# Patient Record
Sex: Male | Born: 1935 | Race: White | Hispanic: No | Smoking: Former smoker
Health system: Southern US, Community
[De-identification: ages and names within clinical notes are randomized; demographics above are authoritative.]

## PROBLEM LIST (undated history)

## (undated) DIAGNOSIS — G629 Polyneuropathy, unspecified: Secondary | ICD-10-CM

## (undated) DIAGNOSIS — IMO0002 Reserved for concepts with insufficient information to code with codable children: Secondary | ICD-10-CM

## (undated) DIAGNOSIS — R079 Chest pain, unspecified: Secondary | ICD-10-CM

## (undated) DIAGNOSIS — M1990 Unspecified osteoarthritis, unspecified site: Secondary | ICD-10-CM

## (undated) DIAGNOSIS — G8929 Other chronic pain: Secondary | ICD-10-CM

## (undated) DIAGNOSIS — I272 Pulmonary hypertension, unspecified: Secondary | ICD-10-CM

## (undated) DIAGNOSIS — I10 Essential (primary) hypertension: Secondary | ICD-10-CM

## (undated) DIAGNOSIS — R9430 Abnormal result of cardiovascular function study, unspecified: Secondary | ICD-10-CM

## (undated) DIAGNOSIS — I2510 Atherosclerotic heart disease of native coronary artery without angina pectoris: Secondary | ICD-10-CM

## (undated) DIAGNOSIS — K219 Gastro-esophageal reflux disease without esophagitis: Secondary | ICD-10-CM

## (undated) DIAGNOSIS — M545 Low back pain: Secondary | ICD-10-CM

## (undated) DIAGNOSIS — E785 Hyperlipidemia, unspecified: Secondary | ICD-10-CM

## (undated) HISTORY — DX: Pulmonary hypertension, unspecified: I27.20

## (undated) HISTORY — PX: CHOLECYSTECTOMY: SHX55

## (undated) HISTORY — DX: Hyperlipidemia, unspecified: E78.5

## (undated) HISTORY — DX: Chest pain, unspecified: R07.9

## (undated) HISTORY — DX: Essential (primary) hypertension: I10

## (undated) HISTORY — DX: Atherosclerotic heart disease of native coronary artery without angina pectoris: I25.10

## (undated) HISTORY — DX: Abnormal result of cardiovascular function study, unspecified: R94.30

## (undated) HISTORY — DX: Low back pain: M54.5

## (undated) HISTORY — DX: Polyneuropathy, unspecified: G62.9

## (undated) HISTORY — PX: CATARACT EXTRACTION W/ INTRAOCULAR LENS IMPLANT: SHX1309

## (undated) HISTORY — DX: Reserved for concepts with insufficient information to code with codable children: IMO0002

## (undated) HISTORY — DX: Other chronic pain: G89.29

## (undated) HISTORY — DX: Gastro-esophageal reflux disease without esophagitis: K21.9

---

## 2002-03-01 HISTORY — PX: CORONARY ANGIOPLASTY WITH STENT PLACEMENT: SHX49

## 2002-09-23 ENCOUNTER — Inpatient Hospital Stay (HOSPITAL_COMMUNITY): Admission: EM | Admit: 2002-09-23 | Discharge: 2002-09-25 | Payer: Self-pay | Admitting: *Deleted

## 2002-12-03 ENCOUNTER — Inpatient Hospital Stay (HOSPITAL_COMMUNITY): Admission: EM | Admit: 2002-12-03 | Discharge: 2002-12-05 | Payer: Self-pay | Admitting: Cardiology

## 2004-03-05 ENCOUNTER — Ambulatory Visit: Payer: Self-pay | Admitting: Cardiology

## 2005-03-22 ENCOUNTER — Ambulatory Visit: Payer: Self-pay | Admitting: Cardiology

## 2005-12-01 ENCOUNTER — Ambulatory Visit: Payer: Self-pay | Admitting: Cardiology

## 2006-03-30 ENCOUNTER — Ambulatory Visit: Payer: Self-pay | Admitting: Cardiology

## 2006-12-02 ENCOUNTER — Encounter: Payer: Self-pay | Admitting: Cardiology

## 2006-12-22 ENCOUNTER — Ambulatory Visit: Payer: Self-pay | Admitting: Cardiology

## 2007-06-12 ENCOUNTER — Encounter: Payer: Self-pay | Admitting: Physician Assistant

## 2007-06-12 ENCOUNTER — Ambulatory Visit: Payer: Self-pay | Admitting: Cardiology

## 2007-06-13 ENCOUNTER — Encounter: Payer: Self-pay | Admitting: Cardiology

## 2007-06-13 ENCOUNTER — Encounter: Payer: Self-pay | Admitting: Physician Assistant

## 2007-07-06 ENCOUNTER — Encounter: Payer: Self-pay | Admitting: Physician Assistant

## 2007-08-01 ENCOUNTER — Ambulatory Visit: Payer: Self-pay | Admitting: Cardiology

## 2007-08-08 ENCOUNTER — Ambulatory Visit: Payer: Self-pay | Admitting: Cardiology

## 2007-09-26 ENCOUNTER — Ambulatory Visit: Payer: Self-pay | Admitting: Cardiology

## 2008-05-27 ENCOUNTER — Ambulatory Visit: Payer: Self-pay | Admitting: Cardiology

## 2008-06-06 ENCOUNTER — Ambulatory Visit: Payer: Self-pay | Admitting: Cardiology

## 2008-06-06 ENCOUNTER — Encounter: Payer: Self-pay | Admitting: Cardiology

## 2008-07-23 ENCOUNTER — Encounter: Payer: Self-pay | Admitting: Cardiology

## 2008-08-30 ENCOUNTER — Encounter: Payer: Self-pay | Admitting: Cardiology

## 2008-11-19 DIAGNOSIS — R079 Chest pain, unspecified: Secondary | ICD-10-CM

## 2008-11-19 DIAGNOSIS — I2510 Atherosclerotic heart disease of native coronary artery without angina pectoris: Secondary | ICD-10-CM

## 2008-11-19 DIAGNOSIS — E785 Hyperlipidemia, unspecified: Secondary | ICD-10-CM | POA: Insufficient documentation

## 2008-12-05 ENCOUNTER — Ambulatory Visit: Payer: Self-pay | Admitting: Cardiology

## 2008-12-05 DIAGNOSIS — I10 Essential (primary) hypertension: Secondary | ICD-10-CM | POA: Insufficient documentation

## 2008-12-06 ENCOUNTER — Encounter: Payer: Self-pay | Admitting: Cardiology

## 2009-05-27 ENCOUNTER — Encounter: Payer: Self-pay | Admitting: Cardiology

## 2009-05-29 ENCOUNTER — Encounter: Payer: Self-pay | Admitting: Cardiology

## 2009-05-30 ENCOUNTER — Encounter: Payer: Self-pay | Admitting: Cardiology

## 2009-06-06 ENCOUNTER — Encounter: Payer: Self-pay | Admitting: Cardiology

## 2009-07-10 ENCOUNTER — Ambulatory Visit: Payer: Self-pay | Admitting: Cardiology

## 2009-07-10 DIAGNOSIS — I70219 Atherosclerosis of native arteries of extremities with intermittent claudication, unspecified extremity: Secondary | ICD-10-CM | POA: Insufficient documentation

## 2009-07-15 ENCOUNTER — Encounter: Payer: Self-pay | Admitting: Cardiology

## 2009-07-23 ENCOUNTER — Encounter (INDEPENDENT_AMBULATORY_CARE_PROVIDER_SITE_OTHER): Payer: Self-pay | Admitting: *Deleted

## 2009-09-30 ENCOUNTER — Encounter: Payer: Self-pay | Admitting: Cardiology

## 2009-10-12 ENCOUNTER — Encounter: Payer: Self-pay | Admitting: Cardiology

## 2010-01-21 ENCOUNTER — Ambulatory Visit: Payer: Self-pay | Admitting: Cardiology

## 2010-01-21 DIAGNOSIS — R609 Edema, unspecified: Secondary | ICD-10-CM

## 2010-03-31 NOTE — Letter (Signed)
Summary: [facility] D/C DR. [facility]  [facility] D[facility]C DR. [facility]   Imported By: Zachary George 07/10/2009 08:47:43  _____________________________________________________________________  External Attachment:    Type:   Image     Comment:   External Document

## 2010-03-31 NOTE — Letter (Signed)
Summary: Engineer, materials at [facility]  [address]   [place], [place] [address]   Phone: [phone]  Fax: [phone]        Jul 23, 2009 MRN: [id number]   Wayne [name] [address] [place], [place]  [address]   Dear Wayne [name],  Your test ordered by Selena Batten has been reviewed by your physician (or physician assistant) and was found to be normal or stable. Your physician (or physician assistant) felt no changes were needed at this time.  ____ Echocardiogram  ____ Cardiac Stress Test  ____ Lab Work  __X__ Peripheral vascular study of arms, legs or neck  ____ CT scan or X-ray  ____ Lung or Breathing test  ____ Other:   Thank you.   Hoover Brunette, LPN    Duane Boston, M.D., F.A.C.C. Thressa Sheller, M.D., F.A.C.C. Oneal Grout, M.D., F.A.C.C. Cheree Ditto, M.D., F.A.C.C. Daiva Nakayama, M.D., F.A.C.C. Kenney Houseman, M.D., F.A.C.C. Jeanne Ivan, PA-C

## 2010-03-31 NOTE — Assessment & Plan Note (Signed)
Summary: ONLY Wayne [name] 6 MO FU PER APRIL REMINDER-SRS   Visit Type:  Follow-up Primary Provider:  Sherryll Burger  CC:  follow-up visit.  History of Present Illness: the patient is a 77 year old male patient of coronary artery disease, status post stent placement right coronary 2004. He had a recent Myoview done as well as an echocardiogram last primary care physician earlier this year compared to studies were reportedly within normal limits. The patient denies any chest pain shortness of breath orthopnea PND. At times he feels somewhat depressed. He does report some cramping in the lower extremities.  Preventive Screening-Counseling & Management  Alcohol-Tobacco     Smoking Status: quit     Year Quit: 1940  Current Medications (verified): 1)  Isosorbide Mononitrate Cr 60 Mg Xr24h-Tab (Isosorbide Mononitrate) .... Take 1/2 Tablet By Mouth Daily 2)  Metoprolol Tartrate 50 Mg Tabs (Metoprolol Tartrate) .... Take 1/2 Tablet By Mouth Two Times A Day 3)  Ecotrin 325 Mg Tbec (Aspirin) .... Take 1 Tablet By Mouth Once A Day 4)  Finasteride 5 Mg Tabs (Finasteride) .... Take 1 Tablet By Mouth Once A Day 5)  Hydrocodone-Acetaminophen 7.5-325 Mg Tabs (Hydrocodone-Acetaminophen) .... Take 1 Tablet By Mouth Four Times A Day 6)  Vitamin B-12 1000 Mcg Tabs (Cyanocobalamin) .... Take 1 Tablet By Mouth Once A Day 7)  Multivitamins  Tabs (Multiple Vitamin) .... Take 1 Tablet By Mouth Once A Day 8)  Miralax  Powd (Polyethylene Glycol 3350) .... Use As Directed 9)  Alprazolam 0.25 Mg Tabs (Alprazolam) .... Take 2 Tablet By Mouth Once A Day As Needed 10)  Amlodipine Besylate 5 Mg Tabs (Amlodipine Besylate) .... Take 1 Tablet By Mouth Once A Day 11)  Nitrostat 0.4 Mg Subl (Nitroglycerin) .Marland Kitchen.. 1 Tablet Under Tongue At Onset of Chest Pain; You May Repeat Every 5 Minutes For Up To 3 Doses. 12)  Furosemide 20 Mg Tabs (Furosemide) .... Take 1/4 Tablet By Mouth Once A Day As Needed 13)  Omega-3 Krill Oil 300 Mg Caps (Krill  Oil) .... Take 1 Tablet By Mouth Once A Day  Allergies (verified): 1)  ! Codeine 2)  ! Cortisone  Comments:  Nurse/Medical Assistant: The patient's medications and allergies were reviewed with the patient and were updated in the Medication and Allergy Lists. List reviewed.  Past History:  Past Surgical History: Last updated: 11/19/2008 Cholecystectomy  Family History: Last updated: 11/19/2008 Family History of Coronary Artery Disease:   Social History: Last updated: 11/19/2008 Retired  Married  Tobacco Use - Former.  Alcohol Use - no  Risk Factors: Smoking Status: quit (07/10/2009)  Past Medical History: HYPERLIPIDEMIA-MIXED (ICD-272.4) CHEST PAIN-UNSPECIFIED (ICD-786.50) CAD, NATIVE VESSEL (ICD-414.01) GERD Peripheral neuropathy.  Chronic low back pain  Review of Systems       The patient complains of depression.  The patient denies fatigue, malaise, fever, weight gain/loss, vision loss, decreased hearing, hoarseness, chest pain, palpitations, shortness of breath, prolonged cough, wheezing, sleep apnea, coughing up blood, abdominal pain, blood in stool, nausea, vomiting, diarrhea, heartburn, incontinence, blood in urine, muscle weakness, joint pain, leg swelling, rash, skin lesions, headache, fainting, dizziness, anxiety, enlarged lymph nodes, easy bruising or bleeding, and environmental allergies.    Vital Signs:  Patient profile:   77 year old male Height:      69 inches Weight:      179 pounds BMI:     26.53 Pulse rate:   67 / minute BP sitting:   151 / 71  (left arm) Cuff size:  regular  Vitals Entered By: Carlye Grippe (Jul 10, 2009 9:11 AM) CC: follow-up visit   Physical Exam  Additional Exam:  General: Well-developed, well-nourished in no distress head: Normocephalic and atraumatic eyes PERRLA/EOMI intact, conjunctiva and lids normal nose: No deformity or lesions mouth normal dentition, normal posterior pharynx neck: Supple, no JVD.  No  masses, thyromegaly or abnormal cervical nodes lungs: Normal breath sounds bilaterally without wheezing.  Normal percussion heart: regular rate and rhythm with normal S1 and S2, no S3 or S4.  PMI is normal.  No pathological murmurs abdomen: Normal bowel sounds, abdomen is soft and nontender without masses, organomegaly or hernias noted.  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 His updated medication list for this problem includes:    Metoprolol Tartrate 50 Mg Tabs (Metoprolol tartrate) .Marland Kitchen... Take 1/2 tablet by mouth two times a day    Ecotrin 325 Mg Tbec (Aspirin) .Marland Kitchen... Take 1 tablet by mouth once a day    Amlodipine Besylate 5 Mg Tabs (Amlodipine besylate) .Marland Kitchen... Take 1 tablet by mouth once a day    Furosemide 20 Mg Tabs (Furosemide) .Marland Kitchen... Take 1/4 tablet by mouth once a day as needed  Problem # 3:  CAD, NATIVE VESSEL (ICD-414.01) the patient reports no recurrent chest pain. Continue current medical regimen His updated medication list for this problem includes:    Isosorbide Mononitrate Cr 60 Mg Xr24h-tab (Isosorbide mononitrate) .Marland Kitchen... Take 1/2 tablet by mouth daily    Metoprolol Tartrate 50 Mg Tabs (Metoprolol tartrate) .Marland Kitchen... Take 1/2 tablet by mouth two times a day    Ecotrin 325 Mg Tbec (Aspirin) .Marland Kitchen... Take 1 tablet by mouth once a day    Amlodipine Besylate 5 Mg Tabs (Amlodipine besylate) .Marland Kitchen... Take 1 tablet by mouth once a day    Nitrostat 0.4 Mg Subl  (Nitroglycerin) .Marland Kitchen... 1 tablet under tongue at onset of chest pain; you may repeat every 5 minutes for up to 3 doses.  Patient Instructions: 1)  ABI's 2)  Follow up in  6 months

## 2010-03-31 NOTE — Medication Information (Signed)
Summary: RX Folder/ MEDICATIONS  RX Folder/ MEDICATIONS   Imported By: Dorise Hiss 07/31/2009 16:31:21  _____________________________________________________________________  External Attachment:    Type:   Image     Comment:   External Document

## 2010-03-31 NOTE — Assessment & Plan Note (Signed)
Summary: 6 MO FU PER NOV REMINDER - SRS   Visit Type:  Follow-up Primary Provider:  Dr. Franchot Mimes   History of Present Illness: the patient is a 77 year old male patient of coronary artery disease, status post stent placement right coronary 2004. He had a recent Myoview done as well as an echocardiogram last primary care physician earlier this year compared to studies were reportedly within normal limits. The patient denies any chest pain shortness of breath orthopnea PND. At times he feels somewhat depressed. He does report some cramping in the lower extremities. the patient had laboratory work done in August of 2011. His creatinine was within normal limits. His potassium was also within normal limits. the recent CT scan showed right-sided hydronephrosisWith significant perinephric stranding and fluid and an obstructing stone at the right physical ureteral junction. He also has a left renal cyst. He has mild emphysema noted at the lung bases No hematuria.  Gives out easily. Back legs feet become tired. Some LE edema.  Take la six for 3 days. 1/2 pill. Trying to stay busy.  Preventive Screening-Counseling & Management  Alcohol-Tobacco     Smoking Status: quit  Comments: Quit using tobacco (chewing and smoking) about 40 yrs ago after using tobacco products for >30 yrs  Current Medications (verified): 1)  Isosorbide Mononitrate Cr 60 Mg Xr24h-Tab (Isosorbide Mononitrate) .... Take 1/2 Tablet By Mouth Daily 2)  Metoprolol Tartrate 50 Mg Tabs (Metoprolol Tartrate) .... Take 1/2 Tablet By Mouth Two Times A Day 3)  Ecotrin 325 Mg Tbec (Aspirin) .... Take 1 Tablet By Mouth Once A Day 4)  Finasteride 5 Mg Tabs (Finasteride) .... Take 1 Tablet By Mouth Once A Day 5)  Hydrocodone-Acetaminophen 7.5-325 Mg Tabs (Hydrocodone-Acetaminophen) .... Take 1 Tablet By Mouth Four Times A Day 6)  Vitamin B-12 1000 Mcg Tabs (Cyanocobalamin) .... Take 1 Tablet By Mouth Once A Day 7)  Multivitamins  Tabs  (Multiple Vitamin) .... Take 1 Tablet By Mouth Once A Day 8)  Miralax  Powd (Polyethylene Glycol 3350) .... Use As Directed 9)  Alprazolam 0.5 Mg Tabs (Alprazolam) .... Take 1/2-1 Tablet At Bedtime Sleep 10)  Amlodipine Besylate 5 Mg Tabs (Amlodipine Besylate) .... Take 1 Tablet By Mouth Once A Day 11)  Nitrostat 0.4 Mg Subl (Nitroglycerin) .Marland Kitchen.. 1 Tablet Under Tongue At Onset of Chest Pain; You May Repeat Every 5 Minutes For Up To 3 Doses. 12)  Furosemide 20 Mg Tabs (Furosemide) .... Take 1/4 Tablet By Mouth Once A Day As Needed 13)  Omega-3 Krill Oil 300 Mg Caps (Krill Oil) .... Take 1 Tablet By Mouth Once A Day 14)  Prilosec 20 Mg Cpdr (Omeprazole) .... Take 1 Tablet By Mouth Once A Day  Allergies: 1)  ! Codeine 2)  ! Cortisone  Comments:  Nurse/Medical Assistant: The patient's medications were reviewed with the patient and were updated in the Medication List. Pt brought a list of medications to office visit.  Cyril Loosen, RN, BSN (January 21, 2010 2:51 PM)  Past History:  Past Medical History: Last updated: 07/10/2009 HYPERLIPIDEMIA-MIXED (ICD-272.4) CHEST PAIN-UNSPECIFIED (ICD-786.50) CAD, NATIVE VESSEL (ICD-414.01) GERD Peripheral neuropathy.  Chronic low back pain  Past Surgical History: Last updated: 11/19/2008 Cholecystectomy  Family History: Last updated: 11/19/2008 Family History of Coronary Artery Disease:   Social History: Last updated: 11/19/2008 Retired  Married  Tobacco Use - Former.  Alcohol Use - no  Risk Factors: Smoking Status: quit (01/21/2010)  Review of Systems       The  patient complains of leg swelling.  The patient denies fatigue, malaise, fever, weight gain/loss, vision loss, decreased hearing, hoarseness, chest pain, palpitations, shortness of breath, prolonged cough, wheezing, sleep apnea, coughing up blood, abdominal pain, blood in stool, nausea, vomiting, diarrhea, heartburn, incontinence, blood in urine, muscle weakness, joint pain,  rash, skin lesions, headache, fainting, dizziness, depression, anxiety, enlarged lymph nodes, easy bruising or bleeding, and environmental allergies.    Vital Signs:  Patient profile:   77 year old male Height:      69 inches Weight:      177 pounds Cuff size:   regular  Vitals Entered By: Cyril Loosen, RN, BSN (January 21, 2010 2:46 PM) Comments Follow up office visit. No cardiac complaints.    Physical Exam  Additional Exam:  General: Well-developed, well-nourished in no distress head: Normocephalic and atraumatic eyes PERRLA/EOMI intact, conjunctiva and lids normal nose: No deformity or lesions mouth normal dentition, normal posterior pharynx neck: Supple, no JVD.  No masses, thyromegaly or abnormal cervical nodes lungs: Normal breath sounds bilaterally without wheezing.  Normal percussion heart: regular rate and rhythm with normal S1 and S2, no S3 or S4.  PMI is normal.  No pathological murmurs abdomen: Normal bowel sounds, abdomen is soft and nontender without masses, organomegaly or hernias noted.  No hepatosplenomegaly musculoskeletal: Back normal, normal gait muscle strength and tone normal pulsus: Pulse is normal in all 4 extremities Extremities: No peripheral pitting edema neurologic: Alert and oriented x 3 skin: Intact without lesions or rashes cervical nodes: No significant adenopathy psychologic: Normal affect    Impression & Recommendations:  Problem # 1:  CAD, NATIVE VESSEL (ICD-414.01) patient has no recurrent chest pain.  He can continue with medical therapy. His updated medication list for this problem includes:    Isosorbide Mononitrate Cr 60 Mg Xr24h-tab (Isosorbide mononitrate) .Marland Kitchen... Take 1/2 tablet by mouth daily    Metoprolol Tartrate 50 Mg Tabs (Metoprolol tartrate) .Marland Kitchen... Take 1/2 tablet by mouth two times a day    Ecotrin 325 Mg Tbec (Aspirin) .Marland Kitchen... Take 1 tablet by mouth once a day    Amlodipine Besylate 5 Mg Tabs (Amlodipine besylate) .Marland Kitchen... Take  1 tablet by mouth once a day    Nitrostat 0.4 Mg Subl (Nitroglycerin) .Marland Kitchen... 1 tablet under tongue at onset of chest pain; you may repeat every 5 minutes for up to 3 doses.  Problem # 2:  EDEMA (ICD-782.3) told the patient to take Lasix for 3 days.  He may have some dependent edema.versus some diastolic dysfunction.  His lungs are clear however.  Problem # 3:  ESSENTIAL HYPERTENSION, BENIGN (ICD-401.1) Assessment: Comment Only controlled. His updated medication list for this problem includes:    Metoprolol Tartrate 50 Mg Tabs (Metoprolol tartrate) .Marland Kitchen... Take 1/2 tablet by mouth two times a day    Ecotrin 325 Mg Tbec (Aspirin) .Marland Kitchen... Take 1 tablet by mouth once a day    Amlodipine Besylate 5 Mg Tabs (Amlodipine besylate) .Marland Kitchen... Take 1 tablet by mouth once a day    Furosemide 20 Mg Tabs (Furosemide) .Marland Kitchen... Take 1/4 tablet by mouth once a day as needed  Patient Instructions: 1)  Lasix 20mg  - take 1/2 tab daily x 3 days only then resume previous 2)  Follow up in  6 months

## 2010-07-08 ENCOUNTER — Other Ambulatory Visit: Payer: Self-pay | Admitting: *Deleted

## 2010-07-08 MED ORDER — METOPROLOL TARTRATE 50 MG PO TABS: 25.0000 mg | ORAL_TABLET | Freq: Two times a day (BID) | ORAL | Status: DC

## 2010-07-14 NOTE — Assessment & Plan Note (Signed)
[facility]                          [facility] OFFICE NOTE   NAME:Wayne [name], Wayne [name]                    MRN:          [id number]  DATE:09/26/2007                            DOB:          03-20-1921    CARDIOLOGIST:  Learta Codding, MD, [facility].   PRIMARY CARE PHYSICIAN:  Dr. Linward Foster   REASON FOR VISIT:  Six week followup.   HISTORY OF PRESENT ILLNESS:  Wayne [name] is an 77 year old male patient  with a history of coronary artery disease status post prior stenting to  the distal RCA in the setting of a non-ST elevation myocardial  infarction in July 2004.  He had a nonischemic Cardiolite study in  October 2007.  He was seen in the office in April with complaints of  discomfort in his chest and shortness of breath.  It was felt that this  was somewhat atypical and possibly related to bronchitis.  The patient  was seen back in followup in June and noted that he did not take  antibiotics as previously directed.  He had a problem with over-the-  counter laxatives.  When this was changed, his symptoms seemed to  resolve.  He was seen by Gene Serpe last on August 01, 2007.  Wayne [name]  recommended proceeding with an echocardiogram.  He recommended  proceeding with cardiac catheterization if there was any decline in his  EF or development of a wall motion abnormality.   The patient had an EF of 55-60% by echocardiogram with no wall motion  abnormalities noted.  There was trace mitral regurgitation.   Now, the patient states he is doing well.  He denies any significant  change in his chest discomfort.  This is again atypical.  He denies any  symptoms reminiscent of his previous angina.  He notes continued  shortness of breath with exertion.  There has been no significant  change.  He describes NYHA class II to class III symptoms.  He denies  orthopnea, PND, or pedal edema.  He does have significant lower back  pain and is seeing an orthopedist, Dr.  Alvester Morin.  The plan at this time is  to proceed with an MRI of his lumbar spine and also to proceed with  spinal injections.   CURRENT MEDICATIONS:  1. Metoprolol 50 mg half tablet b.i.d.  2. Aspirin 325 mg daily.  3. Isosorbide 60 mg half tablet daily.  4. Nizatidine 150 mg b.i.d.  5. GlycoLax.  6. Multivitamin B12.  7. Flaxseed oil.  8. Alprazolam.  9. Avodart 5 mg daily.  10.Hydrocodone p.r.n.   PHYSICAL EXAMINATION:  GENERAL:  He is a well-nourished, well-developed  male, in no acute distress.  VITAL SIGNS:  Blood pressure is 152/77, pulse 63, and weight 176.6  pounds.  HEENT:  Normal.  NECK:  Without JVD.  CARDIAC:  Normal S1 and S2.  Regular rate and rhythm.  LUNGS:  Clear to auscultation bilaterally.  ABDOMEN:  Soft and nontender.  EXTREMITIES:  No edema.  Calves soft and nontender.  SKIN:  Warm and dry.  NEUROLOGIC:  Alert and oriented x3.  Cranial II through XII are grossly  intact.  VASCULAR:  No carotid artery bruits noted bilaterally.   IMPRESSION:  1. Atypical chest discomfort and dyspnea with exertion - stable.  2. Good left ventricular function by recent echocardiogram.  3. Coronary artery disease as previously outlined.  4. Gastroesophageal reflux disease.  5. Dyslipidemia.  6. Peripheral neuropathy.  7. Chronic lower back pain.   PLAN:  1. Wayne [name] returns for followup.  Overall, he is stable without      any significant change.  It seems as though lot of his symptoms are      related to a gastrointestinal etiology and some adjustments in his      medications improved his symptoms.  He denies any symptoms      reminiscent of his previous angina.  Recent echocardiographic study      showed normal LV function and no wall motion abnormalities.  2. The patient requests lumbar spine injections to be performed in the      near future.  He also requests that an MRI to be performed.  I have      written a note for his orthopedist in regards to his MRI.   There is      no contraindication at this time for him to have an MRI of his      lumbar spine.  3. The patient is 5 years post drug-eluting stent placement to his      distal right coronary artery.  His risk of late stent thrombosis is      extremely low.  He has been advised to restart his      aspirin postprocedure as soon as it is felt to be safe by his      orthopedist.  4. The patient will follow up with Dr. Andee Lineman in the next 6 months or      sooner.      Tereso Newcomer, PA-C  Electronically Signed      Jonelle Sidle, MD  Electronically Signed   SW/MedQ  DD: 09/26/2007  DT: 09/27/2007  Job #: [id number]   cc:   Linward Foster

## 2010-07-14 NOTE — Assessment & Plan Note (Signed)
[facility]                          [facility] NOTE   NAME:Wayne [name], Wayne [name]                    MRN:          [id number]  DATE:06/12/2007                            DOB:          [birth year]    CARDIOLOGIST:  Learta Codding, M.D.   PRIMARY CARE PHYSICIAN:  Doreen Beam, M.D.   REASON FOR VISIT:  Six-month follow-up.   HISTORY OF PRESENT ILLNESS:  Wayne [name] is an 77 year old male patient  with a history of coronary artery disease status post non-ST-elevation  myocardial infarction in July 2004 treated with a Taxus stent to the  distal RCA who presents to office to day for follow-up.  Over the last  two to four weeks, he has noted some chest discomfort.  This is an  anterior discomfort that he describes only as a pain.  He notes  radiation between his shoulder blades.  He thinks he may be a little bit  more short of breath but not significantly.  He denies any nausea but  has noted some diaphoresis.  He denies palpitations, syncope, near-  syncope.  Denies orthopnea, PND, or pedal edema.  He describes mild  dyspnea with exertion.  He describes NYHA class II symptoms.  He does  have noted cough productive of clear sputum.  Denies any fevers, chills,  or pertinent sputum.  He denies any hemoptysis.   MEDICATIONS:  1. Metoprolol 50 mg half tablet b.i.d.  2. Aspirin 325 mg daily.  3. Isosorbide 60 mg half tablet daily.  4. Ranitidine 150 mg b.i.d.  5. Glycolax.  6. Multivitamin.  7. B12 vitamin.  8. Flaxseed oil.  9. Hydrocodone/APAP p.r.n.  10.Flomax 0.4 mg daily.  11.Alprazolam 0.5 mg as directed.  12.Nitroglycerin p.r.n. chest pain.   ALLERGIES:  CORTISONE AND CODEINE.   SOCIAL HISTORY:  He is an ex-smoker.   REVIEW OF SYSTEMS:  Denies any fevers, chills, melena, hematochezia,  hematuria, dysuria.  The rest of the review of systems are negative.   PHYSICAL EXAMINATION:  He is a well-nourished, well-developed man in no  distress.  Blood pressure 131/65, pulse 69, weight 170.2 pounds.  HEENT:  Is normal.  NECK:  Without JVD.  CARDIAC:  Normal S1/S2.  Regular rate and rhythm without murmur.  LUNGS:  Clear to auscultation bilaterally.  ABDOMEN:  Soft, nontender with normoactive bowel sounds.  No  organomegaly.  EXTREMITIES:  Without edema.  Calves are soft, nontender.  SKIN:  Warm, dry.  NEUROLOGIC:  He is alert and oriented x3.  Cranial nerves II-XII are  grossly intact.  VASCULAR EXAM:  No carotid bruits noted bilaterally.   Electrocardiogram reveals a sinus rhythm with a heart rate of 70, normal  axis, no acute changes.   IMPRESSION:  1. Atypical chest pain.  2. Coronary artery disease.      a.     Status post non-ST-elevation myocardial infarction July       2004, treated with Taxus drug-eluting stent to the distal right       coronary artery.      b.     Nonobstructive coronary disease  by catheterization October       2004:  30% left anterior descending artery, 70% diagonal 2; obtuse       marginal #1 40% followed by 70% followed by 50%, obtuse marginal       #2 40-50%; right coronary artery 20% proximal followed by 40% mid       and 20% at the site of previous stent followed by 20-30% after the       stent and 20-30% prior to the post lateral system, mid posterior       descending coronary artery 40-50%.      c.     Adenosine Cardiolite October 2007, negative for ischemia.  3. Preserved left ventricular function.  4. Gastroesophageal reflux disease.  5. Dyslipidemia.  6. Peripheral neuropathy.  7. Chronic back pain.  8. History of abnormal chest CT October 2008 with scattered nodular      foci - follow-up recommended in 6 months.  Per Dr. Sherril Croon.   DISCUSSION:  Wayne [name] presents for further evaluation of chest pain.  Upon further review with DeGent who also spoke to the patient, the  patient now notes cough productive of sputum with yellowish color at  times.  He also notes to Dr. Andee Lineman  more of a pleuritic-type symptom.  At this point in time, we will defer stress testing and proceed with  antibiotic therapy to treat possible COPD exacerbation/acute bronchitis.   PLAN:  1. Avelox 400 mg daily for 7 days.  2. Lab work to include CMET, CBC, and TSH.  3. Chest x-ray PA and lateral.  4. The patient has been advised to contact us in the next 7-10 days.      If his symptoms have resolved, then no further workup will be      warranted.  If he continues to have symptoms of chest discomfort,      we will proceed with stress testing.  He would need an adenosine      Cardiolite.  5. Will bring him back in follow-up in the next 4-6 weeks.      Tereso Newcomer, PA-C  Electronically Signed      Learta Codding, MD,FACC  Electronically Signed   SW/MedQ  DD: 06/12/2007  DT: 06/12/2007  Job #: [id number]   cc:   Doreen Beam, MD

## 2010-07-14 NOTE — Assessment & Plan Note (Signed)
[facility]                          [facility]   NAME:Wayne [name], Wayne [name]                    MRN:          [id number]  DATE:05/27/2008                            DOB:          1921/05/02    REFERRING PHYSICIAN:  Linward Foster   HISTORY OF PRESENT ILLNESS:  The patient is an 77 year old male with a  history of coronary artery disease, status post prior stenting to the  distal RCA in the setting of a non-ST-elevation myocardial infarction in  2004.  The patient had nonischemic Cardiolite in 2007.  He was recently  seen in the emergency room because of substernal chest pain.  He was  ruled out for myocardial infarction.  The patient was not admitted.  His  EKG was unchanged.  Reportedly, a CT scan was done, which was negative  for pulmonary embolism.   The patient states now he has been feeling rather well.  He still gets  weak at times on exertion, but he has no chest pain on exertion or at  rest.  He has no orthopnea, PND, palpitations, or syncope.   MEDICATIONS:  1. Metoprolol 50 mg half tablet p.o. b.i.d.  2. Aspirin 325 mg p.o. daily.  3. Isosorbide 60 mg half tablet p.o. daily.  4. GlycoLax  daily.  5. Multivitamin.  6. B12.  7. Flaxseed oil.  8. Avodart 5 mg p.o. daily.  9. Hydrocodone APAP 1 tablet 4 times a day p.r.n.  10.Omeprazole 20 mg p.o. daily.   PHYSICAL EXAMINATION:  VITAL SIGNS:  Blood pressure 141/72, heart rate  is 77, respirations are 20, weight is 176 pounds.  GENERAL:  Well nourished white male in no acute distress.  HEENT:  Pupils, eyes are clear.  Conjunctivae clear.  NECK:  Supple.  Normal carotid upstroke.  No carotid bruits.  LUNGS:  Clear breath sounds bilaterally.  HEART:  Regular rate and rhythm.  Normal S1 and S2.  No murmur, rubs, or  gallops.  ABDOMEN:  Soft and nontender.  No rebound or guarding.  Good bowel  sounds.  EXTREMITIES:  No cyanosis, clubbing, or edema.  NEUROLOGIC:  The patient  is alert and oriented.  Grossly nonfocal.   PROBLEM LIST:  1. History of coronary artery disease, as previously outlined.  2. Recent atypical chest pain.  3. Gastroesophageal reflux disease.  4. Dyslipidemia.  5. Peripheral neuropathy.  6. Chronic low back pain.   PLAN:  1. The patient is doing well.  However, he did have an episode of      chest pain in February.  He has known coronary artery disease and      it has been several years since he had a stress test.  2. I have scheduled the patient for adenosine Cardiolite study to be      done in the next couple of days.  3. I have made no change in the patient's medical regimen.  The      patient's nitroglycerin is still up to date.  He has not been using      any.   We will follow the  patient in 6 months.     Learta Codding, MD,FACC  Electronically Signed    GED/MedQ  DD: 05/27/2008  DT: 05/28/2008  Job #: [id number]   cc:   Linward Foster

## 2010-07-14 NOTE — Assessment & Plan Note (Signed)
[facility]                          [facility]   NAME:Wayne [name], Wayne [name]                    MRN:          [id number]  DATE:12/22/2006                            DOB:          [birth year]    HISTORY OF PRESENT ILLNESS:  The patient is an 77 year old male with  coronary artery disease status post stented right coronary artery in  2004.  The patient reports an episode of chest pain several weeks ago.  He saw Dr. Sherril Croon.  This was felt to be GI-related.  He has had no further  recurrent chest pain.  He also had an echographic study done, which  preliminary was within normal limits.  Reportedly a CT scan of the chest  was done, results of which I do not have available.  The patient states  that he feels fine now.  He has no orthopnea, PND, claudication, or  syncope.   MEDICATIONS:  1. Metoprolol 50 mg 1/2 tablet b.i.d.  2. Aspirin 325 daily.  3. Isosorbide 6 mg 1/2 tablet p.o. daily.  4. Nifedipine 150 mg p.o. b.i.d.  5. GlycoLax.  6. Multivitamin.  7. B12.  8. Flexeril.  9. Hydrocodone.  10.Flomax.  11.Alprazolam 0.5 mg 1/2 tablet p.o. b.i.d.   PHYSICAL EXAMINATION:  VITAL SIGNS:  Blood pressure is 129/65, heart  rate 64 beats per minute, weight 175 pounds.  NECK:  Normal carotid upstroke, no carotid bruits.  LUNGS:  Clear.  HEART:  Regular rate and rhythm.  Normal S1, S2.  No murmurs, rubs, or  gallops.  ABDOMEN:  Soft and nontender.  No rebound or guarding.  Good bowel  sounds.  EXTREMITIES:  No cyanosis, clubbing, or edema.  NEURO:  The patient is alert and oriented, and grossly nonfocal.   PROBLEM LIST:  1. Coronary artery disease.      a.     Status post stent to right coronary artery in 2004.      b.     Nonobstructive coronary artery disease.      c.     Cardiolite negative for ischemia October 2007.  2. Gastroesophageal reflux disease.  3. Dyslipidemia.  4. Peripheral neuropathy.  5. Status post temple artery  biopsy.  6. Chronic low back pain.   PLAN:  1. The patient is doing quite well.  He has no recurrent substernal      chest pain.  2. The patient is stable from a cardiovascular perspective, and I made      no change in his medical regimen.     Learta Codding, MD,FACC  Electronically Signed    GED/MedQ  DD: 12/22/2006  DT: 12/23/2006  Job #: [id number]   cc:   Doreen Beam

## 2010-07-14 NOTE — Assessment & Plan Note (Signed)
[facility]                          [facility]   NAME:Wayne [name], Wayne [name]                    MRN:          [id number]  DATE:08/01/2007                            DOB:          [birth year]    PRIMARY CARDIOLOGIST:  Learta Codding, MD,FACC   REASON FOR VISIT:  Scheduled clinic follow up.  Please refer to [facility],  Natale Lay, previous office note of June 12, 2007, for full  details.   At that time, the patient presented with chest discomfort which was felt  to be atypical.  He also complained of a productive cough with  intermittent yellowish discoloration.  He was given a prescription for  Avelox and also referred for extensive blood work, as well as a chest x-  ray.  Initial thoughts were to proceed with a pharmacologic stress test,  pending resolution of his probable acute bronchitis.   The patient returns today informing me of the following:  He was afraid  to take the Avelox because he thought it would exacerbate his ongoing  abdominal discomfort.  With respect to the abdominal pain, he tells me  that he thinks this is related to a particular type of a stool softener  that he had been taking.  This subsequently resolved after we switched  to a different manufacturer of the same type of stool softener.  He  refers to having a significant hiatal hernia, which he believes was  irritated by the medication that he was taking.   I pressed him specifically on chest discomfort, and he denies any frank  chest pain.  He states that it was more of an epigastric discomfort that  was bothering him.  He does, however, complain of easy fatigability and  exertional dyspnea, which seems to have worsened over the past few  years.  He also refers to a feeling of congestion.  He has been having  more yellowish productive cough, but denies any fever.  He also denies  any PND, orthopnea, or lower extremity edema.   Despite all this, Wayne [name]  remains quite active, taking care of his  wife at home as well as doing all the household chores.  At no time has  he experienced any exertional chest discomfort.  He also denies any  symptoms similar to his NSTEMI in July of 2004, which was treated with  Taxus stenting of the RCA.   Recent blood work was reviewed, all within normal limits including a TSH  level.  A CBC was entirely normal.  Lipid profile showed excellent  control, with an LDL of 66 but an HDL of only 14 with triglycerides of  187.   A two-view chest x-ray showed no evidence of pneumonia or pulmonary  edema.   CURRENT MEDICATIONS:  1. Full dose aspirin.  2. Metoprolol 25 b.i.d.  3. Isosorbide 33 daily.  4. Nizatidine 150 b.i.d.  5. Glycolax as directed.  6. Flax oil 1000 mg daily.  7. Alprazolam 0.25 mg b.i.d.  8. Avodart 5 mg daily.  9. Fexofenadine 180 mg daily.   PHYSICAL EXAMINATION:  VITAL SIGNS:  Blood pressure 124/67, pulse 62,  regular, weight 167.8.  GENERAL:  An 77 year old male, sitting upright, in no distress.  HEENT:  Normocephalic, atraumatic.  NECK:  Palpable carotid pulse without bruits; no JVD at 90 degrees.  LUNGS:  Clear to auscultation in all fields.  HEART:  Regular rate and rhythm (S1 and S2) no significant murmurs.  No  rubs or gallops.  ABDOMEN:  Soft, nontender with intact bowel sounds.  EXTREMITIES:  Trace pedal edema with minimally palpable posterior  tibialis pulses.  NEUROLOGIC:  No focal deficit.   IMPRESSION:  1. Exertional dyspnea.      a.     Question anginal equivalent.  2. Probable bronchitis.  3. Coronary artery disease.      a.     NSTEMI/Taxus stenting distal right coronary artery, July       2004.      b.     Nonischemic adenosine stress Cardiolite; small anterior       apical/moderate inferior scar; ejection fraction 70%, October       2007.  4. Mixed dyslipidemia.  5. Gastroesophageal reflux disease/hiatal hernia.  6. Remote tobacco.   PLAN:  1. 2-D  echocardiogram for reassessment of left ventricular function      and rule out of underlying structural abnormalities.  If this      suggests any decline in ejection fraction, or development of a wall      motion abnormality, then I would keep a low threshold for      considering cardiac catheterization.  The patient also has a      history of mild mitral regurgitation by prior 2D echoes.  2. Patient advised to fill the prescription for Avelox that was given      to him when he was last seen here in April.  I will also add      guaifenesin 600 mg b.i.d. to assist in treating his probable      bronchitis.  3. Schedule return clinic follow up with myself and Dr. Andee Lineman in 4-6      weeks, for review of echo results and further clinical monitoring.      Gene Serpe, PA-C  Electronically Signed      Learta Codding, MD,FACC  Electronically Signed   GS/MedQ  DD: 08/01/2007  DT: 08/01/2007  Job #: [id number]   cc:   Linward Foster

## 2010-07-17 NOTE — H&P (Signed)
NAME:  Wayne [name], Wayne [name]                  ACCOUNT NO.:  [id number]   MEDICAL RECORD NO.:  [id number]                   PATIENT TYPE:  INP   LOCATION:  0340                                 FACILITY:  [facility]   PHYSICIAN:  Veneda Melter, M.D.                   DATE OF BIRTH:  [birth year]   DATE OF ADMISSION:  09/23/2002  DATE OF DISCHARGE:                                HISTORY & PHYSICAL   CHIEF COMPLAINT:  Chest pain.   HISTORY:  Wayne [name] is a 77 year old white male without prior cardiac  history who presents with substernal chest comfort.  The patient has had  nonspecific chest discomfort for approximately 3-4 weeks with nonproductive  cough.  Wayne [name] was see by Dr. Eliberto Ivory and was felt to have a possible viral  infection.  Chest x-ray showed no evidence of pneumonia.  Two days prior to  presentation, Wayne [name] developed some chills with deep inspiration and felt  somewhat diaphoretic.  Wayne [name] had waxing and waning symptoms through the  weekend.  This morning at approximately 3 a.m. Wayne [name] had severe substernal  chest discomfort with radiation to the back, and Wayne [name] presented to [facility].  Wayne [name] was found to be in atrial flutter at [facility], and  subsequently converted to normal sinus rhythm.  A chest CT was performed to  rule out dissection, and this was negative.  Wayne [name] received a nitro patch with  variable relief, as well as narcotics which appeared to ease his pain.  His  ECG suggested nonspecific changes in the inferolateral leads, and cardiac  enzymes were slightly elevated.  Wayne [name] was subsequently transferred to [facility] for further care, and Wayne [name] is currently being admitted to [facility].  Currently, Wayne [name] complains of very minimal chest soreness.  No shortness  of breath, no radiation of pain, no nausea or vomiting.  Wayne [name] denies having  had any recent edema, orthopnea, paroxysmal nocturnal dyspnea.  Wayne [name] has not  had any syncope or presyncope.  No fevers.  Wayne [name] has noted some  chills two  nights prior to presentation.   REVIEW OF SYMPTOMS:  Otherwise noncontributory.   PAST MEDICAL HISTORY:  Notable for dyslipidemia.  Wayne [name] is status post  cholecystectomy.  Wayne [name] has a history of urethral stricture, as well as history  of peripheral neuropathy.   ALLERGIES:  1. CODEINE causes GI upset.  2. CORTIZONE.   MEDICATIONS PRIOR TO ADMISSION:  1. Cholesterol medication.  2. Nerve medicine.  3. Vicodin.  4. Neurontin.  5. Wayne [name] was transferred on IV nitroglycerin, as well as Nitrol paste, and Wayne [name]     was given aspirin in the emergency room.   SOCIAL HISTORY:  The patient lives in [place].  Wayne [name] is married.  Wayne [name] has two  sons.  Wayne [name] has a history of tobacco use, one pack per day for 30 years.  Wayne [name]  quit 30 years ago.  Denies alcohol use.  Wayne [name] is retired from the [employer].   FAMILY HISTORY:  Mother died at age 59 of typhoid fever.  Father died at age  7 of myocardial infarction.  Wayne [name] has a half-brother who has a history of  coronary disease and percutaneous interventions.   PHYSICAL EXAMINATION:  GENERAL:  The patient is a well-developed, well-  nourished, white male in no acute distress.  VITAL SIGNS:  Blood pressure 104/57, pulse 84, temperature 98.0,  respirations 18.  HEENT:  Pupils equal, round and reactive to light.  Extraocular muscles are  intact.  Oropharynx shows no lesions.  NECK:  Supple.  No adenopathy, no bruits.  HEART:  Regular rate.  No murmurs.  LUNGS:  Clear to auscultation.  ABDOMEN:  Soft, nontender.  EXTREMITIES:  There is no peripheral edema.  Extremities are cool to touch.  Motor function is 5/5.  Sensory is intact to touch.   ECG shows a sinus rhythm at 75.  PVCs are noted.  There is nonspecific T  wave flattening inferiorly.   LABORATORY DATA:  White count is 13.6, hemoglobin 14.2, hematocrit 40.6,  platelets 288.  Sodium 131, potassium 4.1 chloride 106, bicarb 27, BUN 16,  creatinine 1.0, glucose 127.  CK is 109, MB 4.1.   Troponin I is 1.20.  This  was on 09/23/02 at 8 a.m.   ASSESSMENT AND PLAN:  Mr. Misiaszek is a 77 year old gentleman who presents  with a 3-4 week history of not feeling well with vague chest complaints.  Over the past 2-3 days, Wayne [name] has had worsening symptoms with associated  chills, diaphoresis, and radiation of pain to his back.  This morning Wayne [name] had  severe increase in his symptoms.  Cardiac enzymes are slightly elevated, and  ECG shows nonspecific changes.  The patient has had variable relief to  nitroglycerin.  Wayne [name] does feel as though IV heparin has helped.  Hemodynamics  are stable at this point.  Will place him on a low-dose beta blocker.  Will  switch him to IV nitroglycerin, start heparin, as well as aspirin.  Will  continue his Neurontin for his peripheral neuropathy, and obtain serial  cardiac enzymes to follow his trend.  Should the patient have recurrence or  worsening of symptoms, urgent cardiac catheterization may be necessary to  define his anatomy.  Otherwise will plan on proceeding with elective cardiac  catheterization in the next 1-2 days to rule out underlying coronary disease  as a cause for his symptoms.  The risks, benefits, and alternatives of  cardiac catheterization, possible percutaneous intervention, were discussed  with the patient in detail, and Wayne [name] understands and wishes to proceed.  All  questions were answered.  Wayne [name] currently is in normal sinus rhythm, had been  in transient atrial flutter at [facility].  Will check a thyroid function  test.  Will also obtain a fasting lipid profile.                                               Veneda Melter, M.D.    NG/MEDQ  D:  09/23/2002  T:  09/23/2002  Job:  [id number]   cc:   Winona Legato, M.D.

## 2010-07-17 NOTE — Cardiovascular Report (Signed)
NAME:  Wayne [name], Wayne [name]                  ACCOUNT NO.:  [id number]   MEDICAL RECORD NO.:  [id number]                   PATIENT TYPE:  OIB   LOCATION:  6526                                 FACILITY:  [facility]   PHYSICIAN:  Charlies Constable, M.D.                  DATE OF BIRTH:  June 27, 1921   DATE OF PROCEDURE:  09/24/2002  DATE OF DISCHARGE:                              CARDIAC CATHETERIZATION   CLINICAL HISTORY:  Mr. Czaplicki is 77 years old and has no prior history of  known heart disease. He was admitted with symptoms of fatigue and chest pain  to [facility] and his troponins were positive at 1.2.  He also had  paroxysmal atrial flutter.  His EKG did not show any significant ST-T  changes.  He was transferred to [facility] for further evaluation with  angiography.   PROCEDURE:  The procedure was performed via the right femoral artery using  arterial sheath and 6 French preformed coronary catheters.  A front wall  arterial puncture was performed and Omnipaque contrast was used.  After  completion of the diagnostic study, made decision to do intervention on the  distal right coronary branch stenosis.   The patient was given Angiomax bolus and infusion and was given 300 mg of  Plavix.  We used a 6 [place] JR-4 guiding catheter with  side holes and a PT2  low support wire.  We crossed the lesion in the distal right coronary artery  and the lesion in the ostium of the posterior descending branch of the right  coronary artery with the wire.  We initially dilated with a 2.5 x 20-mm  Maverick balloon performing two inflations up to 8 atmospheres for 30  seconds.  We then decided to deploy a 2.5 x 24-mm Taxus stent covering the  distal right coronary artery and extending into the posterior descending  branch and treated the ostium of the posterior descending branch and  crossing the AV branch which supplied a posterior lateral branch.  There was  not too much plaque where the AV  branch arose and the angle was not too  acute, so we hoped we could get by without compromising this vessel.  We  deployed this with one inflation of 11 atmospheres for 35 seconds.  We then  post dilated the proximal portion of the stent with a 2.75 x 8-mm Quantum  Maverick performing two inflations up to 16 atmospheres for 30 seconds.  Repeat diagnostics were then performed through the guiding catheter.  The  patient tolerated the procedure well and left the laboratory in satisfactory  condition.   RESULTS:  The left main coronary:  The left main coronary artery was free of  significant disease.   The left anterior descending artery:  The left anterior descending artery  gave rise to four diagonal branches and two septal perforators.  There was  40% narrowing in the third diagonal branch and irregularities  in the LAD.   Circumflex artery:  Circumflex artery gave rise to a marginal branch and two  posterior lateral branches.  First part of the posterior lateral branch had  a long area of 40% segmental narrowing with a focal area of 80% narrowing  near its distal portion.  This was a relatively small vessel.  The second  posterior lateral branch was free of major obstruction.   The right coronary artery:  The right coronary artery is a moderately large  vessel that gave rise to two right ventricular branches, the posterior  descending and posterior lateral branch.  There was 99% stenosis in the  distal right coronary artery before the posterior descending branch and  there was 90% stenosis in the ostium of the posterior descending branch with  TIMI-2 flow into the posterior descending branch.  There were some  collaterals to the distal right coronary artery from the left coronary  system.   LEFT VENTRICULOGRAM:  Left ventriculogram performed in the RAO projection  showed good wall motion with no areas of hypokinesis.  The estimated  ejection fraction was 60%.   Following stenting of  the distal right coronary artery and the ostium of the  posterior descending branch, the distal right coronary improved from 99% to  0% and the posterior descending improved from 90% to less than 10%.  There  was no major compromise of the posterior lateral branch.   CONCLUSION:  1. Coronary artery disease with 40% narrowing in the third diagonal branch     of the left anterior descending, 80% narrowing a posterior lateral branch     of the circumflex artery, 99% stenosis in the distal right coronary     artery with 90% stenosis in the ostium and posterior descending branch of     the distal right coronary artery and normal LV function.  2. Successful stenting of the branch stenosis in the distal right coronary     artery and posterior descending branch with an improvement in the distal     right coronary artery from 99% to 0% and improvement in the posterior     descending branch from 90% to less than 10% with improvement of flow from     TIMI-2 to TIMI-3 flow.   DISPOSITION:  The patient was returned to the __________ for further  observation.                                                   Charlies Constable, M.D.    BB/MEDQ  D:  09/24/2002  T:  09/25/2002  Job:  [id number]   cc:   Bethena Roys  [address]  [place] [address]  Fax: [phone]   Jonelle Sidle, M.D. [facility]

## 2010-07-17 NOTE — Discharge Summary (Signed)
   NAME:  HRITHIK, BOSCHEE                  ACCOUNT NO.:  [id number]   MEDICAL RECORD NO.:  [id number]                   PATIENT TYPE:  INP   LOCATION:  6532                                 FACILITY:  [facility]   PHYSICIAN:  Hockinson Bing, M.D.               DATE OF BIRTH:  [birth year]   DATE OF ADMISSION:  12/03/2002  DATE OF DISCHARGE:  12/05/2002                           DISCHARGE SUMMARY - REFERRING   ADDENDUM:  To discharge summary [id number]   HOSPITAL COURSE:  The patient, prior to discharge, developed atrial  fibrillation with a heart rate (ventricular) around 92 beats per minute.  The patient was unaware of this irregular rhythm.  This was transient and  lasted approximately less than one hour.  He was adamant about no Coumadin.  He was also noted to have slight oozing from the right groin.  Pressure was  held for 15 minutes.   The patient converted back to sinus rhythm and his right groin remained  stable after ambulation and he was felt to be ready for discharge at 1:30  p.m. on December 05, 2002.   Because of his right groin oozing, we will go ahead and have him seen in the  office on December 10, 2002 at 1:15 p.m.  If he has any more oozing or he  notes palpitations, dizziness or any other problems, he is to call us  promptly for immediate attention.      Guy Franco, P.A. [facility]                      Latta Bing, M.D.    LB/MEDQ  D:  12/05/2002  T:  12/05/2002  Job:  [id number]   cc:   Jonelle Sidle, M.D. [facility]   Nena Jordan

## 2010-07-17 NOTE — Assessment & Plan Note (Signed)
[facility]                          [facility] NOTE   NAME:Wayne [name], Wayne [name]                    MRN:          [id number]  DATE:03/30/2006                            DOB:          [birth year]    REFERRING PHYSICIAN:  Weyman Pedro   HISTORY OF PRESENT ILLNESS:  The patient is an 77 year old male with a  history of coronary artery disease status post stent to the right  coronary artery in 2004.  The patient had an episode of chest pain in  October 2007.  Dr. Eliberto Ivory referred him for a Cardiolite study.  This was  low risk.  There appeared to be a small scar in the distribution of the  inferior wall.  The patient states now that he has no recurrent  problems.  He had no recurrent substernal chest pain.  Reportedly he was  taking a medication that was causing him to have chest pain.  This has  been discontinued.  I am not sure exactly what the drug was that he  refers to.   He denies having any substernal chest pain, shortness of breath,  orthopnea, PND, or palpitations.   MEDICATIONS:  1. Metoprolol 50 mg one-half tablet twice a day.  2. Aspirin 325 daily.  3. Isosorbide 60 mg one-half tablet p.o. daily.  4. Nizatidine 150 mg p.o. b.i.d.  5. GlycoLax.  6. Multivitamin.  7. B12.  8. Flax oil.  9. Z-Pak for 5 days.   PHYSICAL EXAMINATION:  VITAL SIGNS:  Blood pressure is 132/62, heart  rate is 68 beats per minute, weight is 171 pounds.  NECK:  Normal carotid upstroke, no carotid bruits.  LUNGS:  Clear breath sounds bilaterally.  HEART:  Regular rate and rhythm, normal S1 and S2.  No murmur, rubs or  gallops.  ABDOMEN:  Soft.  EXTREMITIES:  No cyanosis, clubbing or edema.   PROBLEMS:  1. Coronary artery disease.      a.     Status post stent to the right coronary artery 2004.      b.     Nonobstructive coronary artery disease.      c.     Cardiolite negative for ischemic in October 2007.  2. Gastroesophageal reflux disease.  3.  Dyslipidemia.  4. Peripheral neuropathy.  5. Status post temporal artery biopsy.  6. Chronic low back pain.   PLAN:  1. The patient is doing well from a cardiovascular perspective.  I      made no change in his medical regimen.  2. We reviewed an EKG in the office today and this was essentially      within normal limits with small septal Q waves in V1.   The patient will be seen back in 1 year.     Learta Codding, MD,FACC  Electronically Signed    GED/MedQ  DD: 03/30/2006  DT: 03/30/2006  Job #: [id number]   cc:   Weyman Pedro

## 2010-07-17 NOTE — Cardiovascular Report (Signed)
NAME:  Wayne [name], Wayne [name]                  ACCOUNT NO.:  [id number]   MEDICAL RECORD NO.:  [id number]                   PATIENT TYPE:  INP   LOCATION:  6532                                 FACILITY:  [facility]   PHYSICIAN:  Arturo Morton. Riley Kill, M.D.             DATE OF BIRTH:  19-May-1921   DATE OF PROCEDURE:  12/04/2002  DATE OF DISCHARGE:                              CARDIAC CATHETERIZATION   PROCEDURES PERFORMED:  1. Left heart catheterization.  2. Selective coronary arteriography.  3. Selective left ventriculography.  4. Angio-Seal closure because of back discomfort.   CARDIOLOGIST:  Arturo Morton. Riley Kill, M.D.   INDICATIONS:  [name] is a very delightful 77 year old gentleman who  presents with recurrent chest pain.  The current study was done to assess  coronary anatomy.  In July of this year he underwent stenting of a  subtotally occluded right coronary artery in the setting of non-ST  elevation, myocardial infarction.  He has done well since that time, but now  has recurrent symptoms.  At that time he also had a moderate amount of  circumflex disease.  The current study was done to assess coronary anatomy.   DESCRIPTION OF PROCEDURE:  The patient was brought to the cath lab and  prepped and draped in the usual fashion.  He was complaining of back  discomfort shortly after getting onto the table.  We did give him some  morphine, but that did not result in substantial relief.  Following this  views of the left and right coronary arteries in multiple angiographic  projections.  Ventriculography was performed in the RAO projection.  We  elected to go ahead and discuss Angio-Seal closure with the patient, and he  wanted to try to pursue this if possible.  An Angio-Seal device was then  deployed without complication.  Prior to the deployment the groin was  reprepped and the gloves were changed.  The Angio-Seal device resulted in  excellent hemostasis without complication.   Distal pulses were intact.   The patient was taken to the holding area in satisfactory clinical  condition.   HEMODYNAMIC DATA:  1. Central aorta 118/63, mean 88.   1. Left ventricle 123/7.   1. No aortic to left ventricular gradient on pullback across the aortic     valve.   ANGIOGRAPHIC DATA:  1. Ventriculography was performed in the RAO projection.  Overall systolic     function was well-preserved and no segmental abnormalities or     contractions were identified.  Ejection fraction was 66%.  There was     trace mitral regurgitation.   1. The left main coronary artery was free of critical disease.   1. The left anterior descending artery coursed to the apex.  The LAD itself     had a fair amount of minor mild luminal irregularities.  No critical     areas of stenosis were identified.  There was an area of about 30%  narrowing just overlapping the origin of the second diagonal.  The second     diagonal itself was a relatively small vessel, but had about a 70% area     of segmental narrowing at the proximal portion of the vessel and then     prior to the bifurcation.   1. The circumflex is a fairly large vessel.  There is a first marginal     branch.  The first marginal branch has a long area of segmental plaquing     of about 40% culminating in what appears to be about a 70% stenosis just     before the bifurcation.  Just after the bifurcation there is probably a     50% eccentric plaque leading into the inferior sub-branch.  The inferior     sub-branch itself bifurcates.  The AV circumflex then provides a second     marginal branch and the second marginal branch also has a long area of     segmental 40-50% narrowing.  The AV circumflex provides the atrial     circumflex branch and a third marginal, and these are relatively small     and free of critical disease.   1. The right coronary artery is the previously treated vessel.  The RCA     demonstrates about 20%  narrowing at the first bend.  This is followed by     a 40% narrowing in the midvessel that is slightly eccentric.  We took a     left lateral view to better lay this out and it did not appear to be     critical.  There is some luminal irregularity in the distal vessel of     about 20% bleeding into the area of the stent.  The stent turns into the     posterior descending branch and appears to be widely patent without     significant focal renarrowing.  The continuation branch has about 20-30%     just after the stent and then 20-30% segmental plaquing distally before     it turns into the posterolateral system.  The mid PDA has 40-50%     narrowing as well.   CONCLUSIONS:  1. Well-preserved left ventricular function.  2. Continued patency of the right coronary artery stent at the site of     previous subtotal occlusion.  3. Diffuse circumflex disease, diagonal disease and right coronary disease     as described in the above text.   DISPOSITION:  The patient presents with recurrent symptoms.  The current  findings would suggest that the stent remains patent.  We would recommend  continued medical therapy with slight adjustment with perhaps the addition  of long-acting nitrates.  Continued follow up with Dr. Diona Browner will be  recommended.                                                 Arturo Morton. Riley Kill, M.D.    TDS/MEDQ  D:  12/04/2002  T:  12/04/2002  Job:  [id number]   cc:   Truitt Merle, M.D. [facility]   Cardiovascular Laboratory

## 2010-07-17 NOTE — Discharge Summary (Signed)
NAME:  Wayne [name], Wayne [name]                  ACCOUNT NO.:  [id number]   MEDICAL RECORD NO.:  [id number]                   PATIENT TYPE:  INP   LOCATION:  6532                                 FACILITY:  [facility]   PHYSICIAN:  Jonelle Sidle, M.D. [facility]        DATE OF BIRTH:  April 11, 1921   DATE OF ADMISSION:  12/03/2002  DATE OF DISCHARGE:  12/05/2002                           DISCHARGE SUMMARY - REFERRING   PROCEDURE:  Cardiac catheterization, December 04, 2002.   REASON FOR ADMISSION:  Please refer to dictated admission note.   LABORATORY DATA:  Cardiac enzymes:  Normal serial enzymes (x2).  WBC 9.8,  hematocrit 35.1, platelet count 233 at discharge. Sodium 136, potassium 4.1,  glucose 106, BUN 12, creatinine 0.9.  At discharge, lipid profile: Total  cholesterol 122, triglycerides 98, HDL 32, LDL 70.   HOSPITAL COURSE:  Following transfer from [facility], where the  patient initially presented with recurrent chest discomfort in the context  of known of coronary artery disease and with normal initial cardiac enzymes,  the patient was maintained on a regimen consisting of aspirin, beta blocker,  Plavix, and Lovenox. Additional serial cardiac markers were also normal.   Cardiac catheterization, performed by Dr. Shawnie Pons (see report for  full details), revealed wide patency of the recently placed TAXUS stent of  the distal RCA.  Residual anatomy notable for noncritical CAD, but diffuse  circumflex disease.   Dr. Riley Kill recommended continued medical therapy with consideration for  addition of low-dose nitrates. Left ventricular function was normal. The  right groin was healed with AngioSeal.   The patient was kept overnight for observation. He continued to report  recurrent, nonexertional left-sided chest discomfort which he stated was  different from his presenting symptoms of this past July when he presented  with elevated troponin markers.  The right groin was stable  on examination  with evidence of minimal oozing, but no hematoma and soft bilateral bruits.   MEDICATION ADJUSTMENTS THIS ADMISSION:  An addition of Imdur 30 mg daily.   DISCHARGE MEDICATIONS:  1. Plavix 75 mg daily.  2. Aspirin 325 mg daily.  3. Lopressor 25 mg b.i.d.  4. Axid 150 mg b.i.d.  5. Lopid 600 mg b.i.d.  6. Imdur 30 mg daily.  7. Nitrostat 0.4 mg p.r.n.   DISCHARGE INSTRUCTIONS:  No heavy lifting/driving times two days; maintain a  low fat, low cholesterol diet; call the office if there is any  swelling/bleeding in the groin.   The patient is scheduled to follow up with Dr. Rene Paci on  Tuesday, December 25, 2002, at 10:30 a.m. at the [facility] in [place].   DISCHARGE DIAGNOSES:  1. Recurrent chest pain/known coronary artery disease.     a. Normal serial cardiac markers.     b. Widely patent distal right coronary artery stent.     c. Diffuse, residual circumflex disease.     d. Normal left ventricular function.     e. Status  post non-ST myocardial infarction/stent (TAXUS) to distal right        coronary artery in July 2004.  2. Dyslipidemia.  3. Remote tobacco.  4. History of peripheral neuropathy.     a. Previously treated with Neurontin.      Gene Serpe, P.A. [facility]                      Jonelle Sidle, M.D. [facility]    GS/MEDQ  D:  12/05/2002  T:  12/05/2002  Job:  [id number]   cc:   Donia Pounds, MD  [address], [address]  [place], [place] [address]   [facility]  [address]  [place], [place] [address]

## 2010-07-17 NOTE — Discharge Summary (Signed)
NAME:  Wayne [name], Wayne [name]                  ACCOUNT NO.:  [id number]   MEDICAL RECORD NO.:  [id number]                   PATIENT TYPE:  OIB   LOCATION:  6526                                 FACILITY:  [facility]   PHYSICIAN:  Charlies Constable, M.D.                  DATE OF BIRTH:  01-Nov-1921   DATE OF ADMISSION:  09/24/2002  DATE OF DISCHARGE:  09/25/2002                           DISCHARGE SUMMARY - REFERRING   SUMMARY OF HISTORY:  Wayne [name] is a 77 year old white male who was  transferred to [facility] from [facility] on July 25 for  evaluation of chest discomfort with increased troponin. He describes a  nonspecific chest discomfort for the proceeding three to four weeks  associated with nonproductive cough. He was seen by Dr. Raul Del and felt to  have a possible viral infection; however, chest x-ray was unremarkable. Two  days prior to presentation, he developed chills with deep inspiration and  felt diaphoretic. He had some waxing and waning symptoms throughout the  weekend, and the morning of the admission, he was awakened at 3 a.m. with  severe substernal chest discomfort with radiation to the back; thus his  presentation. At [facility], he was initially found to be in atrial  flutter and converted to normal sinus rhythm.   PAST MEDICAL HISTORY:  1. Remote tobacco use.  2. Hyperlipidemia.  3. Peripheral neuropathy.  4. Mitral stricture.   LABORATORY DATA:  At [facility], sodium was 131, potassium 4.1, BUN  16, creatinine 1.0, glucose 127. Normal LFTs. Total bilirubin was slightly  elevated at 1.1. Initial CK-MB was 109 and 4.1 with a relative index of 3.7  and troponin elevated at 1.20. H and H was 14.2 and 40.6 with normal  indices, platelets 288, WBCs 13.6. EKGs at [facility] revealed  sinus rhythm second degree AV block. Subsequent EKGs showed normal sinus  rhythm with PACs.   HOSPITAL COURSE:  The patient was initially seen at  [facility] by  Delton See and Dr. Chales Abrahams. It was felt that his chest discomfort was  atypical; however, with this increased troponins, it was felt that he should  undergo cardiac catheterization for further evaluation. He was placed on IV  heparin. On July 26, he continued to have some pleuritic chest discomfort.  Enzymes, CK-MBs were negative x3; however, troponins were slightly elevated  at 0.9, 0.91, and 0.66. Cardiac catheterization was performed on July 26 by  Dr. Juanda Chance. This showed a diagonal 3 with a proximal 40% lesion. He had a  distal circumflex of 40/80%  lesion with left to right collaterals. The  distal RCA had a 90% lesion, and the PDA had a 90% lesion. Dr. Juanda Chance  performed Taxus stenting to the distal RCA and PDA without difficulty. His  EF was 60%. Post sheath removal and bedrest, the patient was ambulating  without difficulty. After review, Dr. Juanda Chance felt that the patient could be  discharged home.   DISCHARGE DIAGNOSES:  1. Unstable angina with atypical symptoms status post angioplasty stenting     of the distal RCA and PDA with residual nonobstructive coronary artery     disease.  2. Atrial flutter which resolved at the time of admission.  3. History of hyperlipidemia.  4. History as previously mentioned in the HPI.   DISPOSITION:  Wayne [name] is discharged home. He received new prescriptions  for Plavix 75 mg q.d. for six months, Lopressor 50 mg half a tablet b.i.d.,  and nitroglycerin 0.4 as needed. He is asked to continue coated aspirin 325  q.d. and continue his nerve and cholesterol medications. He was asked to  bring all of his medications to all appointments for review. He was advised  no lifting, driving, sexual activity, or heavy exertion for two days.  Maintain low, fat, salt, and cholesterol diet. If he has any problems with  the cardiac catheterization site, he was asked to call us. He will see Dr.  Diona Browner on August 10 at 1:15 at the  [facility] office. At the time of followup,  review of cardiac factor modification should be pursued. It is noted that he  saw cardiac rehabilitation just prior to his discharge. Dr. Diona Browner will  decide if further evaluation into his paroxysmal atrial flutter at the time  of admission warrants further investigation or anticoagulation. If his  atrial flutter recurs, Dr. Juanda Chance notes that he is a candidate for ablation.      Joellyn Rued, P.A. [facility]                    Charlies Constable, M.D.    EW/MEDQ  D:  09/25/2002  T:  09/25/2002  Job:  [id number]   cc:   Jonelle Sidle, M.D. [facility]   Nena Jordan

## 2010-08-10 ENCOUNTER — Encounter: Payer: Self-pay | Admitting: Cardiology

## 2010-08-20 ENCOUNTER — Ambulatory Visit: Payer: Self-pay | Admitting: Cardiology

## 2010-08-27 ENCOUNTER — Encounter: Payer: Self-pay | Admitting: Cardiology

## 2010-09-01 ENCOUNTER — Other Ambulatory Visit: Payer: Self-pay | Admitting: *Deleted

## 2010-09-01 MED ORDER — ISOSORBIDE MONONITRATE ER 60 MG PO TB24: ORAL_TABLET | ORAL | Status: DC

## 2010-10-22 ENCOUNTER — Ambulatory Visit (INDEPENDENT_AMBULATORY_CARE_PROVIDER_SITE_OTHER): Payer: Medicare Other | Admitting: Cardiology

## 2010-10-22 VITALS — BP 112/61 | HR 73 | Ht 69.0 in | Wt 173.0 lb

## 2010-10-22 DIAGNOSIS — I2510 Atherosclerotic heart disease of native coronary artery without angina pectoris: Secondary | ICD-10-CM

## 2010-10-22 DIAGNOSIS — R609 Edema, unspecified: Secondary | ICD-10-CM

## 2010-10-22 DIAGNOSIS — I10 Essential (primary) hypertension: Secondary | ICD-10-CM

## 2010-10-22 NOTE — Patient Instructions (Signed)
Continue all current medications. Your physician wants you to follow up in: 6 months.  You will receive a reminder letter in the mail one-two months in advance.  If you don't receive a letter, please call our office to schedule the follow up appointment   

## 2010-10-22 NOTE — Assessment & Plan Note (Signed)
No recurrent edema

## 2010-10-22 NOTE — Assessment & Plan Note (Signed)
Coronary artery disease: Continue medical therapy no change in medical regimen. Probably occluded RCA stent but no clinical symptoms and given her advanced age I do not think catheterization or stress testing as needed. Continue risk factor modifications. We'll followup ejection fraction. Could consider the addition of an ACE inhibitor in the future. Blood pressure however relatively low

## 2010-10-22 NOTE — Assessment & Plan Note (Signed)
Controlled.  

## 2010-10-22 NOTE — Progress Notes (Signed)
HPI The patient is an 77 year old male with history of coronary artery disease, status post stent placement to right coronary 2004. A year ago the patient had a Myoview done which was within normal limits. He also had an echocardiogram done. Patient denies any chest pain or shortness of breath. He gets around without much difficulty. However he does report that he sometimes feel overwhelmed and is also probably bloating and constipation. He states that he has a lot of worries because of his wife was difficulty getting around. Apparently she requires a lot of care. He has been put Amitiza and MiraLAX for constipation. He seems to help with the patient is concerned about the cost of the former I told him that he can take it a couple of times a week is that of every day. We did a bedside echocardiogram which showed an area of inferior posterior hypokinesis with overall an ejection fraction of 45% there is no significant mitral regurgitation. It is possible that the patient now is occluded stent to the right coronary artery based on this finding but given his age and the fact that he is asymptomatic I do not think any further workup is required. Also his electrocardiogram does not show any new Q waves in the inferior leads. His EKG is otherwise normal  Allergies  Allergen Reactions  . Codeine   . Cortisone     Current Outpatient Prescriptions on File Prior to Visit  Medication Sig Dispense Refill  . ALPRAZolam (XANAX) 0.5 MG tablet Take 0.25-0.5 mg by mouth at bedtime as needed. 1/2-1 tablet      . amLODipine (NORVASC) 5 MG tablet Take 5 mg by mouth daily.        Marland Kitchen aspirin 325 MG EC tablet Take 325 mg by mouth daily.        . furosemide (LASIX) 20 MG tablet Take 5 mg by mouth daily as needed.        Marland Kitchen HYDROcodone-acetaminophen (NORCO) 7.5-325 MG per tablet Take 1 tablet by mouth 4 (four) times daily.        . isosorbide mononitrate (IMDUR) 60 MG 24 hr tablet Take one-half by mouth daily   15 tablet  6    . metoprolol (LOPRESSOR) 50 MG tablet Take 0.5 tablets (25 mg total) by mouth 2 (two) times daily.  30 tablet  6  . Multiple Vitamin (MULTIVITAMIN) tablet Take 1 tablet by mouth daily.        . nitroGLYCERIN (NITROSTAT) 0.4 MG SL tablet Place 0.4 mg under the tongue every 5 (five) minutes as needed.        . OMEGA-3 KRILL OIL 300 MG CAPS Take 1 capsule by mouth daily. MEGA RED      . omeprazole (PRILOSEC) 20 MG capsule Take 20 mg by mouth daily.        . polyethylene glycol powder (MIRALAX) powder Take 17 g by mouth daily. UAD       . vitamin B-12 (CYANOCOBALAMIN) 1000 MCG tablet Take 1,000 mcg by mouth daily.          Past Medical History  Diagnosis Date  . Other and unspecified hyperlipidemia     mixed  . Chest pain, unspecified   . Coronary atherosclerosis of native coronary artery   . GERD (gastroesophageal reflux disease)   . Peripheral neuropathy   . Chronic low back pain     No past surgical history on file.  Family History  Problem Relation Age of Onset  . Coronary  artery disease      family Hx    History   Social History  . Marital Status: Married    Spouse Name: N/A    Number of Children: N/A  . Years of Education: N/A   Occupational History  . Not on file.   Social History Main Topics  . Smoking status: Former Games developer  . Smokeless tobacco: Not on file  . Alcohol Use: No  . Drug Use: Not on file  . Sexually Active: Not on file   Other Topics Concern  . Not on file   Social History Narrative   Retired, married.   ZOX:[id number] positives as outlined above. The remainder of the 18  point review of systems is negative  PHYSICAL EXAM BP 112/61  Pulse 73  Ht 5\' 9"  (1.753 m)  Wt 173 lb (78.472 kg)  BMI 25.55 kg/m2  General: Well-developed, well-nourished in no distress Head: Normocephalic and atraumatic Eyes:PERRLA/EOMI intact, conjunctiva and lids normal Ears: No deformity or lesions Mouth:normal dentition, normal posterior pharynx Neck: Supple,  no JVD.  No masses, thyromegaly or abnormal cervical nodes Lungs: Normal breath sounds bilaterally without wheezing.  Normal percussion Cardiac: regular rate and rhythm with normal S1 and S2, no S3 or S4.  PMI is normal.  No pathological murmurs Abdomen: Normal bowel sounds, abdomen is soft and nontender without masses, organomegaly or hernias noted.  No hepatosplenomegaly MSK: Back normal, normal gait muscle strength and tone normal Vascular: Pulse is normal in all 4 extremities Extremities: No peripheral pitting edema Neurologic: Alert and oriented x 3 Skin: Intact without lesions or rashes Lymphatics: No significant adenopathy Psychologic: Normal affect   ECG:NSR  ASSESSMENT AND PLAN

## 2011-09-06 ENCOUNTER — Encounter: Payer: Self-pay | Admitting: Cardiology

## 2011-09-06 ENCOUNTER — Other Ambulatory Visit: Payer: Self-pay

## 2011-09-06 DIAGNOSIS — R079 Chest pain, unspecified: Secondary | ICD-10-CM

## 2011-09-07 ENCOUNTER — Encounter: Payer: Self-pay | Admitting: Cardiology

## 2011-09-07 DIAGNOSIS — I2510 Atherosclerotic heart disease of native coronary artery without angina pectoris: Secondary | ICD-10-CM

## 2011-09-10 ENCOUNTER — Telehealth: Payer: Self-pay | Admitting: *Deleted

## 2011-09-10 ENCOUNTER — Encounter: Payer: Self-pay | Admitting: Cardiology

## 2011-09-10 DIAGNOSIS — I272 Pulmonary hypertension, unspecified: Secondary | ICD-10-CM | POA: Insufficient documentation

## 2011-09-10 DIAGNOSIS — R9430 Abnormal result of cardiovascular function study, unspecified: Secondary | ICD-10-CM | POA: Insufficient documentation

## 2011-09-10 NOTE — Telephone Encounter (Signed)
Message copied by Eustace Moore on Fri Sep 10, 2011  1:12 PM ------      Message from: [place], [place] D      Created: Fri Sep 10, 2011 10:34 AM       Echo study looks good.

## 2011-09-10 NOTE — Telephone Encounter (Signed)
Patient informed. 

## 2011-09-29 ENCOUNTER — Encounter: Payer: Self-pay | Admitting: Physician Assistant

## 2011-09-29 ENCOUNTER — Encounter: Payer: Self-pay | Admitting: *Deleted

## 2011-09-29 ENCOUNTER — Telehealth: Payer: Self-pay

## 2011-09-29 ENCOUNTER — Telehealth: Payer: Self-pay | Admitting: *Deleted

## 2011-09-29 ENCOUNTER — Other Ambulatory Visit: Payer: Self-pay | Admitting: Physician Assistant

## 2011-09-29 ENCOUNTER — Ambulatory Visit (INDEPENDENT_AMBULATORY_CARE_PROVIDER_SITE_OTHER): Payer: Medicare Other | Admitting: Physician Assistant

## 2011-09-29 VITALS — BP 177/63 | HR 78 | Ht 70.0 in | Wt 171.0 lb

## 2011-09-29 DIAGNOSIS — I2510 Atherosclerotic heart disease of native coronary artery without angina pectoris: Secondary | ICD-10-CM

## 2011-09-29 DIAGNOSIS — I10 Essential (primary) hypertension: Secondary | ICD-10-CM

## 2011-09-29 DIAGNOSIS — E782 Mixed hyperlipidemia: Secondary | ICD-10-CM

## 2011-09-29 DIAGNOSIS — Z79899 Other long term (current) drug therapy: Secondary | ICD-10-CM

## 2011-09-29 MED ORDER — ASPIRIN EC 81 MG PO TBEC: 81.0000 mg | DELAYED_RELEASE_TABLET | Freq: Every day | ORAL | Status: AC

## 2011-09-29 MED ORDER — AMLODIPINE BESYLATE 10 MG PO TABS: 10.0000 mg | ORAL_TABLET | Freq: Every day | ORAL | Status: DC

## 2011-09-29 NOTE — Progress Notes (Signed)
Will and you Primary Cardiologist: Lewayne Bunting, MD   HPI: Post hospital followup from [facility], following recent evaluation for atypical chest pain. Normal troponins. 2-D echo: EF 65%, no WMAs.  Prior ischemic evaluation with Lexiscan Cardiolite, 2010: No definite ischemia, with normal wall motion; EF 69%.  Clinically he continues to have occasional atypical CP, not precipitated by exertion. However, he refers to it as dull (3/10), lasting approximately 5 minutes in duration. There is no radiation to the jaw or upper extremities, and it is not report reminiscent of when he underwent stenting of the RCA in 2004.  Patient is limited in his mobility. He has since been placed on continuous oxygen, and has established with Dr. Cherie Ouch for treatment of COPD. Of note, during recent hospitalization a CT angiogram of the chest was negative for pulmonary embolus, but yielded new, bilateral groundglass opacities.    Allergies  Allergen Reactions  . Codeine   . Cortisone     Current Outpatient Prescriptions  Medication Sig Dispense Refill  . albuterol (ACCUNEB) 1.25 MG/3ML nebulizer solution Take 1 ampule by nebulization every 6 (six) hours as needed.      . ALPRAZolam (XANAX) 0.5 MG tablet Take 0.25-0.5 mg by mouth at bedtime as needed. 1/2-1 tablet      . amLODipine (NORVASC) 5 MG tablet Take 5 mg by mouth daily.        . fluticasone (FLOVENT HFA) 220 MCG/ACT inhaler Inhale 1 puff into the lungs 2 (two) times daily.      . furosemide (LASIX) 20 MG tablet Take 20 mg by mouth daily as needed.       Marland Kitchen HYDROcodone-acetaminophen (NORCO) 7.5-325 MG per tablet Take 1 tablet by mouth 4 (four) times daily.        . isosorbide mononitrate (IMDUR) 60 MG 24 hr tablet Take one-half by mouth daily   15 tablet  6  . lubiprostone (AMITIZA) 8 MCG capsule Take 8 mcg by mouth. 3 PER WEEK       . metoprolol (LOPRESSOR) 50 MG tablet Take 0.5 tablets (25 mg total) by mouth 2 (two) times daily.  30 tablet  6    . Multiple Vitamin (MULTIVITAMIN) tablet Take 1 tablet by mouth daily.        . nitroGLYCERIN (NITROSTAT) 0.4 MG SL tablet Place 0.4 mg under the tongue every 5 (five) minutes as needed.        . OMEGA-3 KRILL OIL 300 MG CAPS Take 1 capsule by mouth daily. MEGA RED      . omeprazole (PRILOSEC) 20 MG capsule Take 20 mg by mouth daily.        . polyethylene glycol powder (MIRALAX) powder Take 17 g by mouth daily. UAD       . Tamsulosin HCl (FLOMAX) 0.4 MG CAPS Take 0.4 mg by mouth daily.        . vitamin B-12 (CYANOCOBALAMIN) 1000 MCG tablet Take 1,000 mcg by mouth daily.          Past Medical History  Diagnosis Date  . Other and unspecified hyperlipidemia     mixed  . Chest pain, unspecified   . Coronary atherosclerosis of native coronary artery   . GERD (gastroesophageal reflux disease)   . Peripheral neuropathy   . Chronic low back pain   . Ejection fraction     EF 65%, echo, July, 2013  . Pulmonary hypertension     Echo, July, 2013, right ventricular systolic pressure estimate 43 mmHg.  No past surgical history on file.  History   Social History  . Marital Status: Married    Spouse Name: N/A    Number of Children: N/A  . Years of Education: N/A   Occupational History  . Not on file.   Social History Main Topics  . Smoking status: Former Games developer  . Smokeless tobacco: Not on file  . Alcohol Use: No  . Drug Use: Not on file  . Sexually Active: Not on file   Other Topics Concern  . Not on file   Social History Narrative   Retired, married.    Family History  Problem Relation Age of Onset  . Coronary artery disease      family Hx    ROS: no nausea, vomiting; no fever, chills; no melena, hematochezia; no claudication  PHYSICAL EXAM: BP 177/63  Pulse 78  Ht 5\' 10"  (1.778 m)  Wt 171 lb (77.565 kg)  BMI 24.54 kg/m30 GENERAL: 78-year-old male, sitting upright; NAD HEENT: NCAT, PERRLA, EOMI; sclera clear; no xanthelasma NECK: palpable bilateral carotid  pulses, no bruits; no JVD; no TM LUNGS: Diminished breath sounds CARDIAC: RRR (S1, S2); no significant murmurs; no rubs or gallops ABDOMEN: soft, non-tender; intact BS EXTREMETIES: intact distal pulses; no significant peripheral edema SKIN: warm/dry; no obvious rash/lesions MUSCULOSKELETAL: no joint deformity NEURO: no focal deficit; NL affect   EKG: reviewed and available in Electronic Records   ASSESSMENT & PLAN:  CAD, NATIVE VESSEL Recent evaluation consisted of a normal 2-D echocardiogram and negative cardiac markers. However, patient continues to have chest pain, albeit atypical. Last ischemic evaluation was in 2010, with a negative Lexiscan Cardiolite. Will repeat this study to rule out definite ischemia, and reassess clinical status in 2 months. Will reduce aspirin to 81 mg daily, and otherwise continue current medication regimen, which includes beta blocker and long-acting nitrate.  ESSENTIAL HYPERTENSION, BENIGN Currently uncontrolled. Will increase amlodipine to 10 mg daily.  HYPERLIPIDEMIA-MIXED Reassess lipid status with a FLP. Aggressive management recommended with target LDL 70 or less, if feasible.    Gene Jozy Mcphearson, PAC

## 2011-09-29 NOTE — Telephone Encounter (Signed)
Patient in office today to see Gene Serpe, PA.  Son called back to inform us that he has not had lipid panel since 2011 with PMD.  Advised him order will be faxed to [facility].  Son verbalized understanding.

## 2011-09-29 NOTE — Assessment & Plan Note (Signed)
Recent evaluation consisted of a normal 2-D echocardiogram and negative cardiac markers. However, patient continues to have chest pain, albeit atypical. Last ischemic evaluation was in 2010, with a negative Lexiscan Cardiolite. Will repeat this study to rule out definite ischemia, and reassess clinical status in 2 months. Will reduce aspirin to 81 mg daily, and otherwise continue current medication regimen, which includes beta blocker and long-acting nitrate.

## 2011-09-29 NOTE — Assessment & Plan Note (Signed)
Reassess lipid status with a FLP. Aggressive management recommended with target LDL 70 or less, if feasible.

## 2011-09-29 NOTE — Assessment & Plan Note (Signed)
Currently uncontrolled. Will increase amlodipine to 10 mg daily.

## 2011-09-29 NOTE — Telephone Encounter (Signed)
Auth # [id number] exp 10/28/11

## 2011-09-29 NOTE — Patient Instructions (Signed)
   Lab: fasting lipid panel - find out if PMD Sherryll Burger) has done this recently.  If not, our office will send lab order to [facility] to be done  Decrease Aspirin to 162mg  daily, then 81mg  daily thereafter  Increase Norvasc to 10mg  daily (may take 2 of the 5mg  tabs till finish current supply)  Lexiscan Cardiolite (stress test)  Office will contact with results Follow up in  2 months

## 2011-09-29 NOTE — Telephone Encounter (Signed)
Lexiscan cardiolite   Weight 171  Scheduled for 10-04-2011  [facility]  Checking percert

## 2011-10-04 DIAGNOSIS — I2510 Atherosclerotic heart disease of native coronary artery without angina pectoris: Secondary | ICD-10-CM

## 2011-10-11 ENCOUNTER — Telehealth: Payer: Self-pay | Admitting: *Deleted

## 2011-10-11 NOTE — Telephone Encounter (Signed)
Wayne [name] called stating that he is trying to return a call to [facility].

## 2011-10-12 ENCOUNTER — Telehealth: Payer: Self-pay | Admitting: *Deleted

## 2011-10-12 DIAGNOSIS — Z79899 Other long term (current) drug therapy: Secondary | ICD-10-CM

## 2011-10-12 DIAGNOSIS — E785 Hyperlipidemia, unspecified: Secondary | ICD-10-CM

## 2011-10-12 MED ORDER — ATORVASTATIN CALCIUM 20 MG PO TABS: 20.0000 mg | ORAL_TABLET | Freq: Every day | ORAL | Status: DC

## 2011-10-12 NOTE — Telephone Encounter (Signed)
Message copied by Eustace Moore on Tue Oct 12, 2011  8:15 AM ------      Message from: Rande Brunt      Created: Fri Oct 01, 2011 12:39 PM       LDL 92. Start Lipitor 20 daily, and FLP/LFT profile in 12 weeks

## 2011-10-12 NOTE — Telephone Encounter (Signed)
Message copied by Eustace Moore on Tue Oct 12, 2011  8:14 AM ------      Message from: Rande Brunt      Created: Fri Oct 08, 2011 10:52 AM       NL stress test; EF 75%. No further w/u

## 2011-10-12 NOTE — Telephone Encounter (Signed)
Patient informed. 

## 2011-10-12 NOTE — Telephone Encounter (Signed)
Spoke with patient today. See original notes for details.

## 2011-10-12 NOTE — Telephone Encounter (Signed)
Patient informed. New prescription sent to [facility] and lab order faxed to [facility] lab along with copy mailed to patient home address.

## 2011-10-26 ENCOUNTER — Telehealth: Payer: Self-pay | Admitting: Physician Assistant

## 2011-10-26 NOTE — Telephone Encounter (Signed)
WALK IN needs to speak to someone about his meds.Marland KitchenMarland Kitchen

## 2011-10-27 NOTE — Telephone Encounter (Signed)
States he was recently started on Lipitor 20mg  every evening, but declines to take at this time.  States he had been on this some time in the past & had problems with medication.  Also, he states at his age he just does not want to add anything else.  Does have OV scheduled for 9/27.

## 2011-11-26 ENCOUNTER — Ambulatory Visit: Payer: Medicare Other | Admitting: Physician Assistant

## 2011-12-03 ENCOUNTER — Encounter: Payer: Self-pay | Admitting: Physician Assistant

## 2011-12-03 ENCOUNTER — Ambulatory Visit (INDEPENDENT_AMBULATORY_CARE_PROVIDER_SITE_OTHER): Payer: Medicare Other | Admitting: Physician Assistant

## 2011-12-03 VITALS — BP 107/51 | HR 66 | Ht 69.0 in | Wt 179.0 lb

## 2011-12-03 DIAGNOSIS — I272 Pulmonary hypertension, unspecified: Secondary | ICD-10-CM

## 2011-12-03 DIAGNOSIS — I10 Essential (primary) hypertension: Secondary | ICD-10-CM | POA: Insufficient documentation

## 2011-12-03 DIAGNOSIS — I2510 Atherosclerotic heart disease of native coronary artery without angina pectoris: Secondary | ICD-10-CM

## 2011-12-03 DIAGNOSIS — E785 Hyperlipidemia, unspecified: Secondary | ICD-10-CM | POA: Insufficient documentation

## 2011-12-03 DIAGNOSIS — I2789 Other specified pulmonary heart diseases: Secondary | ICD-10-CM

## 2011-12-03 MED ORDER — NITROGLYCERIN 0.4 MG SL SUBL: 0.4000 mg | SUBLINGUAL_TABLET | SUBLINGUAL | Status: DC | PRN

## 2011-12-03 NOTE — Patient Instructions (Signed)
Continue all current medications. Your physician wants you to follow up in: 6 months.  You will receive a reminder letter in the mail one-two months in advance.  If you don't receive a letter, please call our office to schedule the follow up appointment   

## 2011-12-03 NOTE — Assessment & Plan Note (Signed)
Markedly improved since last visit (177/63), following increase in amlodipine.

## 2011-12-03 NOTE — Progress Notes (Signed)
Primary Cardiologist: Lewayne Bunting, MD   HPI: Patient presents for scheduled followup.  When last seen, I referred him for a pharmacologic nuclear study to rule out ischemia, in the setting of continued atypical CP. This study was completed, and revealed no definite ischemia; EF 74% with normal wall motion.  Lipid status was also reassessed, with LDL 92  Clinically, he reports much less CP, and again denies any strict correlation with exertion. Results of recent stress test were reviewed. He continues to remain quite active; in fact, he is now able to have his O2 tank with him on his riding lawnmower, so that he can continue remaining very active and doing his own yard work, which he very much enjoys.  Allergies  Allergen Reactions  . Codeine   . Cortisone     Current Outpatient Prescriptions  Medication Sig Dispense Refill  . albuterol (ACCUNEB) 1.25 MG/3ML nebulizer solution Take 1 ampule by nebulization 2 (two) times daily.      Marland Kitchen ALPRAZolam (XANAX) 0.5 MG tablet Take 0.25-0.5 mg by mouth at bedtime as needed. 1/2-1 tablet      . amLODipine (NORVASC) 10 MG tablet Take 1 tablet (10 mg total) by mouth daily.  30 tablet  6  . aspirin EC 81 MG tablet Take 1 tablet (81 mg total) by mouth daily.      . fluticasone (FLOVENT HFA) 220 MCG/ACT inhaler Inhale 1 puff into the lungs 2 (two) times daily.      Marland Kitchen HYDROcodone-acetaminophen (NORCO) 7.5-325 MG per tablet Take 1 tablet by mouth 4 (four) times daily.        . isosorbide mononitrate (IMDUR) 60 MG 24 hr tablet Take one-half by mouth daily   15 tablet  6  . lubiprostone (AMITIZA) 8 MCG capsule Take 8 mcg by mouth. 3 PER WEEK       . metoprolol (LOPRESSOR) 50 MG tablet Take 25 mg by mouth 2 (two) times daily.      . Multiple Vitamin (MULTIVITAMIN) tablet Take 1 tablet by mouth daily.        . nitroGLYCERIN (NITROSTAT) 0.4 MG SL tablet Place 1 tablet (0.4 mg total) under the tongue every 5 (five) minutes as needed.  25 tablet  3  . OMEGA-3  KRILL OIL 300 MG CAPS Take 1 capsule by mouth daily. MEGA RED      . omeprazole (PRILOSEC) 20 MG capsule Take 20 mg by mouth daily.        . OXYGEN-HELIUM IN Inhale 2 L into the lungs as directed.      . polyethylene glycol powder (MIRALAX) powder Take 17 g by mouth daily. UAD       . Tamsulosin HCl (FLOMAX) 0.4 MG CAPS Take 0.4 mg by mouth daily.        . vitamin B-12 (CYANOCOBALAMIN) 1000 MCG tablet Take 1,000 mcg by mouth daily.        Marland Kitchen DISCONTD: metoprolol (LOPRESSOR) 50 MG tablet Take 0.5 tablets (25 mg total) by mouth 2 (two) times daily.  30 tablet  6  . DISCONTD: nitroGLYCERIN (NITROSTAT) 0.4 MG SL tablet Place 0.4 mg under the tongue every 5 (five) minutes as needed.          Past Medical History  Diagnosis Date  . Other and unspecified hyperlipidemia     mixed  . Chest pain, unspecified   . Coronary atherosclerosis of native coronary artery     Status post stent RCA, 2004  . GERD (gastroesophageal reflux disease)   .  Peripheral neuropathy   . Chronic low back pain   . Ejection fraction     EF 65%, echo, July, 2013  . Pulmonary hypertension     Echo, July, 2013, right ventricular systolic pressure estimate 43 mmHg.  Marland Kitchen HTN (hypertension)     No past surgical history on file.  History   Social History  . Marital Status: Married    Spouse Name: N/A    Number of Children: N/A  . Years of Education: N/A   Occupational History  . Not on file.   Social History Main Topics  . Smoking status: Former Games developer  . Smokeless tobacco: Never Used  . Alcohol Use: No  . Drug Use: Not on file  . Sexually Active: Not on file   Other Topics Concern  . Not on file   Social History Narrative   Retired, married.    Family History  Problem Relation Age of Onset  . Coronary artery disease      family Hx    ROS: no nausea, vomiting; no fever, chills; no melena, hematochezia; no claudication  PHYSICAL EXAM: BP 107/51  Pulse 66  Ht 5\' 9"  (1.753 m)  Wt 179 lb (81.194 kg)   BMI 26.43 kg/m2  SpO2 94% GENERAL: 78 year old male, sitting upright; NAD  HEENT: NCAT, PERRLA, EOMI; sclera clear; no xanthelasma; nasal cannula   NECK: palpable bilateral carotid pulses, no bruits; no JVD; no TM  LUNGS: Diminished breath sounds  CARDIAC: RRR (S1, S2); no significant murmurs; no rubs or gallops  ABDOMEN: soft, non-tender; intact BS  EXTREMETIES:  1+ bilateral pitting peripheral edema  SKIN: warm/dry; no obvious rash/lesions  MUSCULOSKELETAL: no joint deformity  NEURO: no focal deficit; NL affect    EKG:    ASSESSMENT & PLAN:  CAD, NATIVE VESSEL No further workup indicated. Results of recent stress test were reviewed. Continue medical management is recommended. Patient appears to be experiencing less CP, following recent increase in amlodipine dose. We'll renew prescription for NTG, and reassess clinical status in 6 months.  HTN (hypertension) Markedly improved since last visit (177/63), following increase in amlodipine.  HYPERLIPIDEMIA-MIXED Results of recent lipid profile (LDL 92) were reviewed. However, he continues to decline initiation of statin therapy.  Pulmonary hypertension On continuous O2, followed by Dr. Coralie Common, [facility]

## 2011-12-03 NOTE — Assessment & Plan Note (Signed)
On continuous O2, followed by Dr. Orson Aloe

## 2011-12-03 NOTE — Assessment & Plan Note (Signed)
Results of recent lipid profile (LDL 92) were reviewed. However, he continues to decline initiation of statin therapy.

## 2011-12-03 NOTE — Assessment & Plan Note (Signed)
No further workup indicated. Results of recent stress test were reviewed. Continue medical management is recommended. Patient appears to be experiencing less CP, following recent increase in amlodipine dose. We'll renew prescription for NTG, and reassess clinical status in 6 months.

## 2012-05-03 ENCOUNTER — Other Ambulatory Visit: Payer: Self-pay | Admitting: Physician Assistant

## 2012-08-27 ENCOUNTER — Other Ambulatory Visit: Payer: Self-pay | Admitting: Physician Assistant

## 2012-12-14 ENCOUNTER — Ambulatory Visit: Payer: Medicare Other | Admitting: Cardiovascular Disease

## 2013-01-03 ENCOUNTER — Other Ambulatory Visit: Payer: Self-pay | Admitting: *Deleted

## 2013-01-03 MED ORDER — AMLODIPINE BESYLATE 10 MG PO TABS: 10.0000 mg | ORAL_TABLET | Freq: Every day | ORAL | Status: DC

## 2013-01-09 ENCOUNTER — Encounter: Payer: Self-pay | Admitting: Cardiovascular Disease

## 2013-01-09 ENCOUNTER — Ambulatory Visit (INDEPENDENT_AMBULATORY_CARE_PROVIDER_SITE_OTHER): Payer: 59 | Admitting: Cardiovascular Disease

## 2013-01-09 VITALS — BP 136/66 | HR 71 | Ht 69.0 in | Wt 176.0 lb

## 2013-01-09 DIAGNOSIS — Z79899 Other long term (current) drug therapy: Secondary | ICD-10-CM

## 2013-01-09 DIAGNOSIS — I272 Pulmonary hypertension, unspecified: Secondary | ICD-10-CM

## 2013-01-09 DIAGNOSIS — I10 Essential (primary) hypertension: Secondary | ICD-10-CM

## 2013-01-09 DIAGNOSIS — I2510 Atherosclerotic heart disease of native coronary artery without angina pectoris: Secondary | ICD-10-CM

## 2013-01-09 DIAGNOSIS — I2789 Other specified pulmonary heart diseases: Secondary | ICD-10-CM

## 2013-01-09 DIAGNOSIS — E785 Hyperlipidemia, unspecified: Secondary | ICD-10-CM

## 2013-01-09 MED ORDER — AMLODIPINE BESYLATE 10 MG PO TABS: 10.0000 mg | ORAL_TABLET | Freq: Every day | ORAL | Status: DC

## 2013-01-09 NOTE — Progress Notes (Signed)
Patient ID: Wayne [name], male   DOB: 27-Nov-1921, 79 y.o.   MRN: [id number]      SUBJECTIVE: Wayne [name] is a very pleasant 79 yr old man who is here to f/u for his CAD. He underwent a normal nuclear stress test on 10-05-2011 with a calculated LVEF of 75%. He denies exertional chest pain, palpitations, shortness of breath, and syncope. His main problem appears to be constipation. He occasionally has musculoskeletal chest pain and has some arthritis issues with his back, and takes Lyrica for peripheral neuropathy.    Allergies  Allergen Reactions  . Codeine   . Cortisone     Current Outpatient Prescriptions  Medication Sig Dispense Refill  . albuterol (ACCUNEB) 1.25 MG/3ML nebulizer solution Take 1 ampule by nebulization 2 (two) times daily as needed.       . ALPRAZolam (XANAX) 0.5 MG tablet Take 0.25-0.5 mg by mouth at bedtime as needed. 1/2-1 tablet      . amLODipine (NORVASC) 10 MG tablet Take 1 tablet (10 mg total) by mouth daily.  5 tablet  0  . aspirin EC 81 MG tablet Take 81 mg by mouth daily.      Marland Kitchen docusate sodium (COLACE) 100 MG capsule Take 100 mg by mouth daily.      . fluticasone (FLOVENT HFA) 220 MCG/ACT inhaler Inhale 1 puff into the lungs daily.       Marland Kitchen HYDROcodone-acetaminophen (NORCO) 7.5-325 MG per tablet Take 1 tablet by mouth 4 (four) times daily.        . isosorbide mononitrate (IMDUR) 60 MG 24 hr tablet Take one-half by mouth daily   15 tablet  6  . metoprolol (LOPRESSOR) 50 MG tablet Take 25 mg by mouth 2 (two) times daily.      . Multiple Vitamin (MULTIVITAMIN) tablet Take 1 tablet by mouth daily.        . Multiple Vitamins-Minerals (OCUVITE PO) Take 1 tablet by mouth daily.      . nitroGLYCERIN (NITROSTAT) 0.4 MG SL tablet Place 1 tablet (0.4 mg total) under the tongue every 5 (five) minutes as needed.  25 tablet  3  . OMEGA-3 KRILL OIL 300 MG CAPS Take 1 capsule by mouth daily. MEGA RED      . omeprazole (PRILOSEC) 20 MG capsule Take 20 mg by mouth daily.         . OXYGEN-HELIUM IN Inhale 2 L into the lungs as directed.      . polyethylene glycol powder (MIRALAX) powder Take 17 g by mouth daily. UAD       . pregabalin (LYRICA) 50 MG capsule Take 50 mg by mouth daily.      . Tamsulosin HCl (FLOMAX) 0.4 MG CAPS Take 0.4 mg by mouth daily.        . vitamin B-12 (CYANOCOBALAMIN) 1000 MCG tablet Take 1,000 mcg by mouth daily.         No current facility-administered medications for this visit.    Past Medical History  Diagnosis Date  . Other and unspecified hyperlipidemia     mixed  . Chest pain, unspecified   . Coronary atherosclerosis of native coronary artery     Status post stent RCA, 2004  . GERD (gastroesophageal reflux disease)   . Peripheral neuropathy   . Chronic low back pain   . Ejection fraction     EF 65%, echo, July, 2013  . Pulmonary hypertension     Echo, July, 2013, right ventricular systolic pressure estimate 43  mmHg.  . HTN (hypertension)     No past surgical history on file.  History   Social History  . Marital Status: Married    Spouse Name: N/A    Number of Children: N/A  . Years of Education: N/A   Occupational History  . Not on file.   Social History Main Topics  . Smoking status: Former Smoker -- 1.00 packs/day for 30 years    Types: Cigarettes    Quit date: 03/01/1958  . Smokeless tobacco: Never Used  . Alcohol Use: No  . Drug Use: Not on file  . Sexual Activity: Not on file   Other Topics Concern  . Not on file   Social History Narrative   Retired, married.     Filed Vitals:   01/09/13 1421  BP: 136/66  Pulse: 71  Height: 5\' 9"  (1.753 m)  Weight: 176 lb (79.833 kg)  SpO2: 92%    PHYSICAL EXAM General: NAD Neck: No JVD, no thyromegaly or thyroid nodule.  Lungs: Clear to auscultation bilaterally with normal respiratory effort. CV: Nondisplaced PMI.  Heart regular S1/S2, no S3/S4, no murmur.  No peripheral edema.  No carotid bruit.  Normal pedal pulses.  Abdomen: Soft, nontender, no  hepatosplenomegaly, no distention.  Neurologic: Alert and oriented x 3.  Psych: Normal affect. Extremities: No clubbing or cyanosis.   ECG: reviewed and available in electronic records.      ASSESSMENT AND PLAN: CAD, NATIVE VESSEL  Symptomatically stable on current medication regimen, with normal stress test in 09/2011. Continue ASA, metoprolol, amlodipine, fish oil, and Imdur.  HTN (hypertension)  Well controlled on amlodipine 10 mg daily.  HYPERLIPIDEMIA-MIXED  He continues to decline initiation of statin therapy.   Pulmonary hypertension  Wears oxygen at night. Follows with Dr. Orson Aloe.      Prentice Docker, M.D., F.A.C.C.

## 2013-01-09 NOTE — Patient Instructions (Signed)
   Refill sent to pharm for Amlodipine Continue all other medications.   Your physician wants you to follow up in:  1 year.  You will receive a reminder letter in the mail one-two months in advance.  If you don't receive a letter, please call our office to schedule the follow up appointment

## 2013-12-10 ENCOUNTER — Encounter (HOSPITAL_COMMUNITY): Payer: Self-pay | Admitting: Pharmacy Technician

## 2013-12-20 ENCOUNTER — Encounter (HOSPITAL_COMMUNITY): Payer: Self-pay

## 2013-12-20 ENCOUNTER — Other Ambulatory Visit: Payer: Self-pay

## 2013-12-20 ENCOUNTER — Encounter (HOSPITAL_COMMUNITY)
Admission: RE | Admit: 2013-12-20 | Discharge: 2013-12-20 | Disposition: A | Discharge status: Home / Self Care | Payer: 59 | Source: Ambulatory Visit | Attending: Ophthalmology | Admitting: Ophthalmology

## 2013-12-20 DIAGNOSIS — Z01818 Encounter for other preprocedural examination: Secondary | ICD-10-CM | POA: Diagnosis not present

## 2013-12-20 DIAGNOSIS — H269 Unspecified cataract: Secondary | ICD-10-CM | POA: Insufficient documentation

## 2013-12-20 HISTORY — DX: Unspecified osteoarthritis, unspecified site: M19.90

## 2013-12-20 LAB — HEMOGLOBIN AND HEMATOCRIT, BLOOD
HEMATOCRIT: 38.4 % — AB (ref 39.0–52.0)
Hemoglobin: 13.5 g/dL (ref 13.0–17.0)

## 2013-12-20 LAB — BASIC METABOLIC PANEL
ANION GAP: 11 (ref 5–15)
BUN: 22 mg/dL (ref 6–23)
CHLORIDE: 102 meq/L (ref 96–112)
CO2: 27 meq/L (ref 19–32)
Calcium: 9.3 mg/dL (ref 8.4–10.5)
Creatinine, Ser: 1.04 mg/dL (ref 0.50–1.35)
GFR calc Af Amer: 71 mL/min — ABNORMAL LOW (ref >90)
GFR calc non Af Amer: 61 mL/min — ABNORMAL LOW (ref >90)
Glucose, Bld: 128 mg/dL — ABNORMAL HIGH (ref 70–99)
Potassium: 4.6 mEq/L (ref 3.7–5.3)
Sodium: 140 mEq/L (ref 137–147)

## 2013-12-20 NOTE — Patient Instructions (Signed)
Your procedure is scheduled on: 12/25/2013  Report to [facility] at  730  AM.  Call this number if you have problems the morning of surgery: [phone]   Do not eat food or drink liquids :After Midnight.      Take these medicines the morning of surgery with A SIP OF WATER:xanax, hydrocodone, amlodipine, isosorbide, metoprolol, prilosec, lyrica. Take your flovent before you come.   Do not wear jewelry, make-up or nail polish.  Do not wear lotions, powders, or perfumes.   Do not shave 48 hours prior to surgery.  Do not bring valuables to the hospital.  Contacts, dentures or bridgework may not be worn into surgery.  Leave suitcase in the car. After surgery it may be brought to your room.  For patients admitted to the hospital, checkout time is 11:00 AM the day of discharge.   Patients discharged the day of surgery will not be allowed to drive home.  :     Please read over the following fact sheets that you were given: Coughing and Deep Breathing, Surgical Site Infection Prevention, Anesthesia Post-op Instructions and Care and Recovery After Surgery    Cataract A cataract is a clouding of the lens of the eye. When a lens becomes cloudy, vision is reduced based on the degree and nature of the clouding. Many cataracts reduce vision to some degree. Some cataracts make people more near-sighted as they develop. Other cataracts increase glare. Cataracts that are ignored and become worse can sometimes look white. The white color can be seen through the pupil. CAUSES   Aging. However, cataracts may occur at any age, even in newborns.   Certain drugs.   Trauma to the eye.   Certain diseases such as diabetes.   Specific eye diseases such as chronic inflammation inside the eye or a sudden attack of a rare form of glaucoma.   Inherited or acquired medical problems.  SYMPTOMS   Gradual, progressive drop in vision in the affected eye.   Severe, rapid visual loss. This most often happens when  trauma is the cause.  DIAGNOSIS  To detect a cataract, an eye doctor examines the lens. Cataracts are best diagnosed with an exam of the eyes with the pupils enlarged (dilated) by drops.  TREATMENT  For an early cataract, vision may improve by using different eyeglasses or stronger lighting. If that does not help your vision, surgery is the only effective treatment. A cataract needs to be surgically removed when vision loss interferes with your everyday activities, such as driving, reading, or watching TV. A cataract may also have to be removed if it prevents examination or treatment of another eye problem. Surgery removes the cloudy lens and usually replaces it with a substitute lens (intraocular lens, IOL).  At a time when both you and your doctor agree, the cataract will be surgically removed. If you have cataracts in both eyes, only one is usually removed at a time. This allows the operated eye to heal and be out of danger from any possible problems after surgery (such as infection or poor wound healing). In rare cases, a cataract may be doing damage to your eye. In these cases, your caregiver may advise surgical removal right away. The vast majority of people who have cataract surgery have better vision afterward. HOME CARE INSTRUCTIONS  If you are not planning surgery, you may be asked to do the following:  Use different eyeglasses.   Use stronger or brighter lighting.   Ask your  eye doctor about reducing your medicine dose or changing medicines if it is thought that a medicine caused your cataract. Changing medicines does not make the cataract go away on its own.   Become familiar with your surroundings. Poor vision can lead to injury. Avoid bumping into things on the affected side. You are at a higher risk for tripping or falling.   Exercise extreme care when driving or operating machinery.   Wear sunglasses if you are sensitive to bright light or experiencing problems with glare.  SEEK  IMMEDIATE MEDICAL CARE IF:   You have a worsening or sudden vision loss.   You notice redness, swelling, or increasing pain in the eye.   You have a fever.  Document Released: 02/15/2005 Document Revised: 02/04/2011 Document Reviewed: 10/09/2010 [facility] Patient Information 2012 [place].PATIENT INSTRUCTIONS POST-ANESTHESIA  IMMEDIATELY FOLLOWING SURGERY:  Do not drive or operate machinery for the first twenty four hours after surgery.  Do not make any important decisions for twenty four hours after surgery or while taking narcotic pain medications or sedatives.  If you develop intractable nausea and vomiting or a severe headache please notify your doctor immediately.  FOLLOW-UP:  Please make an appointment with your surgeon as instructed. You do not need to follow up with anesthesia unless specifically instructed to do so.  WOUND CARE INSTRUCTIONS (if applicable):  Keep a dry clean dressing on the anesthesia/puncture wound site if there is drainage.  Once the wound has quit draining you may leave it open to air.  Generally you should leave the bandage intact for twenty four hours unless there is drainage.  If the epidural site drains for more than 36-48 hours please call the anesthesia department.  QUESTIONS?:  Please feel free to call your physician or the hospital operator if you have any questions, and they will be happy to assist you.

## 2013-12-24 MED ORDER — PHENYLEPHRINE HCL 2.5 % OP SOLN
OPHTHALMIC | Status: AC
Start: 2013-12-24 — End: 2013-12-24
  Filled 2013-12-24: qty 15

## 2013-12-24 MED ORDER — CYCLOPENTOLATE-PHENYLEPHRINE OP SOLN OPTIME - NO CHARGE
OPHTHALMIC | Status: AC
  Filled 2013-12-24: qty 2

## 2013-12-24 MED ORDER — TETRACAINE HCL 0.5 % OP SOLN
OPHTHALMIC | Status: AC
Start: 2013-12-24 — End: 2013-12-24
  Filled 2013-12-24: qty 2

## 2013-12-24 MED ORDER — KETOROLAC TROMETHAMINE 0.5 % OP SOLN
OPHTHALMIC | Status: AC
Start: 2013-12-24 — End: 2013-12-24
  Filled 2013-12-24: qty 5

## 2013-12-25 ENCOUNTER — Encounter (HOSPITAL_COMMUNITY)
Admission: RE | Disposition: A | Discharge status: Home / Self Care | Payer: Self-pay | Source: Ambulatory Visit | Attending: Ophthalmology

## 2013-12-25 ENCOUNTER — Ambulatory Visit (HOSPITAL_COMMUNITY)
Admission: RE | Admit: 2013-12-25 | Discharge: 2013-12-25 | Disposition: A | Discharge status: Home / Self Care | Payer: Medicare Other | Source: Ambulatory Visit | Attending: Ophthalmology | Admitting: Ophthalmology

## 2013-12-25 ENCOUNTER — Encounter (HOSPITAL_COMMUNITY): Payer: Self-pay | Admitting: *Deleted

## 2013-12-25 ENCOUNTER — Encounter (HOSPITAL_COMMUNITY): Payer: Medicare Other | Admitting: Anesthesiology

## 2013-12-25 ENCOUNTER — Ambulatory Visit (HOSPITAL_COMMUNITY): Payer: Medicare Other | Admitting: Anesthesiology

## 2013-12-25 DIAGNOSIS — Z7951 Long term (current) use of inhaled steroids: Secondary | ICD-10-CM | POA: Insufficient documentation

## 2013-12-25 DIAGNOSIS — I272 Other secondary pulmonary hypertension: Secondary | ICD-10-CM | POA: Insufficient documentation

## 2013-12-25 DIAGNOSIS — Z79899 Other long term (current) drug therapy: Secondary | ICD-10-CM | POA: Diagnosis not present

## 2013-12-25 DIAGNOSIS — H2512 Age-related nuclear cataract, left eye: Secondary | ICD-10-CM | POA: Diagnosis present

## 2013-12-25 DIAGNOSIS — I10 Essential (primary) hypertension: Secondary | ICD-10-CM | POA: Insufficient documentation

## 2013-12-25 DIAGNOSIS — Z7982 Long term (current) use of aspirin: Secondary | ICD-10-CM | POA: Diagnosis not present

## 2013-12-25 DIAGNOSIS — I25118 Atherosclerotic heart disease of native coronary artery with other forms of angina pectoris: Secondary | ICD-10-CM | POA: Diagnosis not present

## 2013-12-25 DIAGNOSIS — K219 Gastro-esophageal reflux disease without esophagitis: Secondary | ICD-10-CM | POA: Diagnosis not present

## 2013-12-25 HISTORY — PX: CATARACT EXTRACTION W/PHACO: SHX586

## 2013-12-25 SURGERY — PHACOEMULSIFICATION, CATARACT, WITH IOL INSERTION
Anesthesia: Monitor Anesthesia Care | Site: Eye | Laterality: Left

## 2013-12-25 MED ORDER — TETRACAINE HCL 0.5 % OP SOLN
1.0000 [drp] | OPHTHALMIC | Status: AC
  Administered 2013-12-25 (×3): 1 [drp] via OPHTHALMIC

## 2013-12-25 MED ORDER — EPINEPHRINE HCL 1 MG/ML IJ SOLN
INTRAMUSCULAR | Status: AC
  Filled 2013-12-25: qty 1

## 2013-12-25 MED ORDER — ONDANSETRON HCL 4 MG/2ML IJ SOLN
INTRAMUSCULAR | Status: AC
  Filled 2013-12-25: qty 2

## 2013-12-25 MED ORDER — KETOROLAC TROMETHAMINE 0.5 % OP SOLN
1.0000 [drp] | OPHTHALMIC | Status: AC
  Administered 2013-12-25 (×3): 1 [drp] via OPHTHALMIC

## 2013-12-25 MED ORDER — FENTANYL CITRATE 0.05 MG/ML IJ SOLN
25.0000 ug | INTRAMUSCULAR | Status: AC
  Administered 2013-12-25: 25 ug via INTRAVENOUS

## 2013-12-25 MED ORDER — BSS IO SOLN
INTRAOCULAR | Status: DC | PRN
  Administered 2013-12-25: 15 mL via INTRAOCULAR

## 2013-12-25 MED ORDER — MIDAZOLAM HCL 2 MG/2ML IJ SOLN
1.0000 mg | INTRAMUSCULAR | Status: DC | PRN
  Administered 2013-12-25: 2 mg via INTRAVENOUS

## 2013-12-25 MED ORDER — EPINEPHRINE HCL 1 MG/ML IJ SOLN
INTRAOCULAR | Status: DC | PRN
  Administered 2013-12-25: 09:00:00

## 2013-12-25 MED ORDER — MIDAZOLAM HCL 2 MG/2ML IJ SOLN
INTRAMUSCULAR | Status: AC
  Filled 2013-12-25: qty 2

## 2013-12-25 MED ORDER — TETRACAINE 0.5 % OP SOLN OPTIME - NO CHARGE
OPHTHALMIC | Status: DC | PRN
  Administered 2013-12-25: 2 [drp] via OPHTHALMIC

## 2013-12-25 MED ORDER — LACTATED RINGERS IV SOLN
INTRAVENOUS | Status: DC | PRN
  Administered 2013-12-25: 08:00:00 via INTRAVENOUS

## 2013-12-25 MED ORDER — FENTANYL CITRATE 0.05 MG/ML IJ SOLN
INTRAMUSCULAR | Status: AC
  Filled 2013-12-25: qty 2

## 2013-12-25 MED ORDER — PROVISC 10 MG/ML IO SOLN
INTRAOCULAR | Status: DC | PRN
  Administered 2013-12-25: 0.85 mL via INTRAOCULAR

## 2013-12-25 MED ORDER — PHENYLEPHRINE HCL 2.5 % OP SOLN
1.0000 [drp] | OPHTHALMIC | Status: AC
  Administered 2013-12-25 (×3): 1 [drp] via OPHTHALMIC

## 2013-12-25 MED ORDER — LACTATED RINGERS IV SOLN
INTRAVENOUS | Status: DC
  Administered 2013-12-25: 1000 mL via INTRAVENOUS

## 2013-12-25 MED ORDER — CYCLOPENTOLATE-PHENYLEPHRINE 0.2-1 % OP SOLN
1.0000 [drp] | OPHTHALMIC | Status: AC
  Administered 2013-12-25 (×3): 1 [drp] via OPHTHALMIC

## 2013-12-25 SURGICAL SUPPLY — 25 items
CAPSULAR TENSION RING-AMO (OPHTHALMIC RELATED) IMPLANT
CLOTH BEACON ORANGE TIMEOUT ST (SAFETY) ×3 IMPLANT
EYE SHIELD UNIVERSAL CLEAR (GAUZE/BANDAGES/DRESSINGS) ×3 IMPLANT
GLOVE BIO SURGEON STRL SZ 6.5 (GLOVE) ×2 IMPLANT
GLOVE BIO SURGEONS STRL SZ 6.5 (GLOVE) ×1
GLOVE ECLIPSE 6.5 STRL STRAW (GLOVE) IMPLANT
GLOVE ECLIPSE 7.0 STRL STRAW (GLOVE) IMPLANT
GLOVE EXAM NITRILE LRG STRL (GLOVE) IMPLANT
GLOVE EXAM NITRILE MD LF STRL (GLOVE) ×3 IMPLANT
GLOVE SKINSENSE NS SZ6.5 (GLOVE)
GLOVE SKINSENSE STRL SZ6.5 (GLOVE) IMPLANT
HEALON 5 0.6 ML (INTRAOCULAR LENS) IMPLANT
KIT VITRECTOMY (OPHTHALMIC RELATED) IMPLANT
PAD ARMBOARD 7.5X6 YLW CONV (MISCELLANEOUS) ×3 IMPLANT
PROC W NO LENS (INTRAOCULAR LENS)
PROC W SPEC LENS (INTRAOCULAR LENS)
PROCESS W NO LENS (INTRAOCULAR LENS) IMPLANT
PROCESS W SPEC LENS (INTRAOCULAR LENS) IMPLANT
RETRACTOR IRIS SIGHTPATH (OPHTHALMIC RELATED) IMPLANT
RING MALYGIN (MISCELLANEOUS) IMPLANT
SIGHTPATH CAT PROC W REG LENS (Ophthalmic Related) ×3 IMPLANT
TAPE SURG TRANSPARENT 2IN (GAUZE/BANDAGES/DRESSINGS) ×1 IMPLANT
TAPE TRANSPARENT 2IN (GAUZE/BANDAGES/DRESSINGS) ×2
VISCOELASTIC ADDITIONAL (OPHTHALMIC RELATED) IMPLANT
WATER STERILE IRR 250ML POUR (IV SOLUTION) ×3 IMPLANT

## 2013-12-25 NOTE — Op Note (Signed)
Patient brought to the operating room and prepped and draped in the usual manner.  Lid speculum inserted in left eye.  Stab incision made at the twelve o'clock position.  Provisc instilled in the anterior chamber.   A 2.4 mm. Stab incision was made temporally.  An anterior capsulotomy was done with a bent 25 gauge needle.  The nucleus was hydrodissected.  The Phaco tip was inserted in the anterior chamber and the nucleus was emulsified.  CDE was 9.07.  The cortical material was then removed with the I and A tip.  Posterior capsule was the polished.  The anterior chamber was deepened with Provisc.  A 22.0 Alcon [id number] IOL was then inserted in the capsular bag.  Provisc was then removed with the I and A tip.  The wound was then hydrated.  Patient sent to the Recovery Room in good condition with follow up in my office.  Preoperative Diagnosis:  Nuclear Cataract OS Postoperative Diagnosis:  Same Procedure name: Kelman Phacoemulsification OS with IOL

## 2013-12-25 NOTE — H&P (Signed)
The patient was re examined and there is no change in the patients condition since the original H and P. 

## 2013-12-25 NOTE — Transfer of Care (Signed)
Immediate Anesthesia Transfer of Care Note  Patient: Wayne [name]  Procedure(s) Performed: Procedure(s) with comments: CATARACT EXTRACTION PHACO AND INTRAOCULAR LENS PLACEMENT (IOC) (Left) - CDE:9.07  Patient Location: Short Stay  Anesthesia Type:MAC  Level of Consciousness: awake, alert , oriented and patient cooperative  Airway & Oxygen Therapy: Patient Spontanous Breathing  Post-op Assessment: Report given to PACU RN and Post -op Vital signs reviewed and stable  Post vital signs: Reviewed and stable  Complications: No apparent anesthesia complications

## 2013-12-25 NOTE — Discharge Instructions (Signed)
Wayne [name]  12/25/2013           [facility] Instructions [address]- [place] [id number] [address]      1. Avoid closing eyes tightly. One often closes the eye tightly when laughing, talking, sneezing, coughing or if they feel irritated. At these times, you should be careful not to close your eyes tightly.  2. Instill eye drops as instructed. To instill drops in your eye, open it, look up and have someone gently pull the lower lid down and instill a couple of drops inside the lower lid.  3. Do not touch upper lid.  4. Take Advil or Tylenol for pain.  5. You may use either eye for near work, such as reading or sewing and you may watch television.  6. You may have your hair done at the beauty parlor at any time.  7. Wear dark glasses with or without your own glasses if you are in bright light.  8. Call our office at [phone] or [phone] if you have sharp pain in your eye or unusual symptoms.  9. Do not be concerned because vision in the operative eye is not good. It will not be good, no matter how successful the operation, until you get a special lens for it. Your old glasses will not be suited to the new eye that was operated on and you will not be ready for a new lens for about a month.  10. [facility].    I have received a copy of the above instructions and will follow them.     Follow up appt today between 2-3 pm with Dr. Nile RiggsShapiro

## 2013-12-25 NOTE — Anesthesia Postprocedure Evaluation (Signed)
  Anesthesia Post-op Note  Patient: Wayne [name]  Procedure(s) Performed: Procedure(s) with comments: CATARACT EXTRACTION PHACO AND INTRAOCULAR LENS PLACEMENT (IOC) (Left) - CDE:9.07  Patient Location: Short Stay  Anesthesia Type:MAC  Level of Consciousness: awake, alert , oriented and patient cooperative  Airway and Oxygen Therapy: Patient Spontanous Breathing  Post-op Pain: none  Post-op Assessment: Post-op Vital signs reviewed, Patient's Cardiovascular Status Stable, Respiratory Function Stable, Patent Airway, No signs of Nausea or vomiting and Pain level controlled  Post-op Vital Signs: Reviewed and stable  Last Vitals:  Filed Vitals:   12/25/13 0845  BP: 85/41  Pulse:   Temp:   Resp: 46    Complications: No apparent anesthesia complications

## 2013-12-25 NOTE — Anesthesia Procedure Notes (Signed)
Procedure Name: MAC Date/Time: 12/25/2013 8:51 AM Performed by: Pernell DupreADAMS, AMY A Pre-anesthesia Checklist: Patient identified, Timeout performed, Emergency Drugs available, Suction available and Patient being monitored Oxygen Delivery Method: Nasal cannula

## 2013-12-25 NOTE — Anesthesia Preprocedure Evaluation (Signed)
Anesthesia Evaluation  Patient identified by MRN, date of birth, ID band Patient awake    Reviewed: Allergy & Precautions, H&P , NPO status , Patient's Chart, lab work & pertinent test results, reviewed documented beta blocker date and time   Airway Mallampati: II  TM Distance: >3 FB     Dental  (+) Edentulous Upper, Edentulous Lower   Pulmonary former smoker,  pulm htn  breath sounds clear to auscultation        Cardiovascular hypertension, Pt. on home beta blockers and Pt. on medications + angina with exertion + CAD, + Cardiac Stents and + Peripheral Vascular Disease Rhythm:Regular Rate:Normal     Neuro/Psych  Neuromuscular disease    GI/Hepatic GERD-  ,  Endo/Other    Renal/GU      Musculoskeletal  (+) Arthritis -,   Abdominal   Peds  Hematology   Anesthesia Other Findings   Reproductive/Obstetrics                             Anesthesia Physical Anesthesia Plan  ASA: III  Anesthesia Plan: MAC   Post-op Pain Management:    Induction: Intravenous  Airway Management Planned: Nasal Cannula  Additional Equipment:   Intra-op Plan:   Post-operative Plan:   Informed Consent: I have reviewed the patients History and Physical, chart, labs and discussed the procedure including the risks, benefits and alternatives for the proposed anesthesia with the patient or authorized representative who has indicated his/her understanding and acceptance.     Plan Discussed with:   Anesthesia Plan Comments:         Anesthesia Quick Evaluation

## 2013-12-26 ENCOUNTER — Encounter (HOSPITAL_COMMUNITY): Payer: Self-pay | Admitting: Ophthalmology

## 2014-01-01 ENCOUNTER — Other Ambulatory Visit: Payer: Self-pay | Admitting: *Deleted

## 2014-01-01 MED ORDER — AMLODIPINE BESYLATE 10 MG PO TABS: 10.0000 mg | ORAL_TABLET | Freq: Every day | ORAL | Status: DC

## 2014-01-04 ENCOUNTER — Encounter: Payer: Self-pay | Admitting: Cardiovascular Disease

## 2014-01-04 ENCOUNTER — Ambulatory Visit (INDEPENDENT_AMBULATORY_CARE_PROVIDER_SITE_OTHER): Payer: 59 | Admitting: Cardiovascular Disease

## 2014-01-04 VITALS — BP 127/65 | HR 69 | Ht 70.0 in | Wt 180.8 lb

## 2014-01-04 DIAGNOSIS — I272 Pulmonary hypertension, unspecified: Secondary | ICD-10-CM

## 2014-01-04 DIAGNOSIS — E785 Hyperlipidemia, unspecified: Secondary | ICD-10-CM

## 2014-01-04 DIAGNOSIS — I2510 Atherosclerotic heart disease of native coronary artery without angina pectoris: Secondary | ICD-10-CM

## 2014-01-04 DIAGNOSIS — I10 Essential (primary) hypertension: Secondary | ICD-10-CM

## 2014-01-04 DIAGNOSIS — I270 Primary pulmonary hypertension: Secondary | ICD-10-CM

## 2014-01-04 NOTE — Patient Instructions (Signed)
Continue all current medications. Your physician wants you to follow up in:  1 year.  You will receive a reminder letter in the mail one-two months in advance.  If you don't receive a letter, please call our office to schedule the follow up appointment   

## 2014-01-04 NOTE — Progress Notes (Signed)
Patient ID: Wayne [name], male   DOB: 09/06/1921, 80 y.o.   MRN: [id number]      SUBJECTIVE: Mr. Skeet Latcharleir is a very pleasant 80 yr old man who is here to follow up for CAD. He underwent a normal nuclear stress test on 10-05-2011 with a calculated LVEF of 75%. He denies exertional chest pain, palpitations, shortness of breath, and syncope. He did not think he would be around to be at this visit when I told him at his last visit that I would see him in one year. He wears oxygen at night and sometimes during the day when saturations fall below 90%. He carries an oxygen saturation monitor with him. He recently underwent left cataract surgery which went well. When he bends over he feels a catch or cramp in his rib cage. His wife who is soon to be 78 years old is in a nursing home and this is really bothering him. He has lower extremity neuropathy for which he takes Lyrica daily.   Review of Systems: As per "subjective", otherwise negative.  Allergies  Allergen Reactions  . Codeine   . Cortisone     Current Outpatient Prescriptions  Medication Sig Dispense Refill  . albuterol (ACCUNEB) 1.25 MG/3ML nebulizer solution Take 1 ampule by nebulization 2 (two) times daily as needed for wheezing or shortness of breath.     . ALPRAZolam (XANAX) 0.5 MG tablet Take 0.25-0.5 mg by mouth at bedtime as needed. 1/2-1 tablet    . amLODipine (NORVASC) 10 MG tablet Take 1 tablet (10 mg total) by mouth daily. 90 tablet 3  . aspirin EC 81 MG tablet Take 81 mg by mouth daily.    . fluticasone (FLOVENT HFA) 220 MCG/ACT inhaler Inhale 1 puff into the lungs daily.     Marland Kitchen. HYDROcodone-acetaminophen (NORCO) 7.5-325 MG per tablet Take 1 tablet by mouth 4 (four) times daily.      . isosorbide mononitrate (IMDUR) 60 MG 24 hr tablet Take one-half by mouth daily  15 tablet 6  . metoprolol (LOPRESSOR) 50 MG tablet Take 25 mg by mouth 2 (two) times daily.    . Multiple Vitamin (MULTIVITAMIN) tablet Take 1 tablet by mouth daily.       . Multiple Vitamins-Minerals (OCUVITE PO) Take 1 tablet by mouth daily.    . nitroGLYCERIN (NITROSTAT) 0.4 MG SL tablet Place 1 tablet (0.4 mg total) under the tongue every 5 (five) minutes as needed. 25 tablet 3  . OMEGA-3 KRILL OIL 300 MG CAPS Take 1 capsule by mouth daily. MEGA RED    . omeprazole (PRILOSEC) 20 MG capsule Take 20 mg by mouth daily.      . OXYGEN-HELIUM IN Inhale 2 L into the lungs as directed.    . polyethylene glycol powder (MIRALAX) powder Take 17 g by mouth daily. UAD     . pregabalin (LYRICA) 50 MG capsule Take 50 mg by mouth daily.    . Tamsulosin HCl (FLOMAX) 0.4 MG CAPS Take 0.4 mg by mouth daily.      . vitamin B-12 (CYANOCOBALAMIN) 1000 MCG tablet Take 1,000 mcg by mouth daily.       No current facility-administered medications for this visit.    Past Medical History  Diagnosis Date  . Other and unspecified hyperlipidemia     mixed  . Chest pain, unspecified   . Coronary atherosclerosis of native coronary artery     Status post stent RCA, 2004  . GERD (gastroesophageal reflux disease)   .  Peripheral neuropathy   . Chronic low back pain   . Ejection fraction     EF 65%, echo, July, 2013  . Pulmonary hypertension     Echo, July, 2013, right ventricular systolic pressure estimate 43 mmHg.  Marland Kitchen. HTN (hypertension)   . Arthritis     Past Surgical History  Procedure Laterality Date  . Cholecystectomy    . Cataract extraction w/ intraocular lens implant Right   . Coronary angioplasty with stent placement  2004  . Cataract extraction w/phaco Left 12/25/2013    Procedure: CATARACT EXTRACTION PHACO AND INTRAOCULAR LENS PLACEMENT (IOC);  Surgeon: Loraine LericheMark T. Nile RiggsShapiro, MD;  Location: [facility];  Service: Ophthalmology;  Laterality: Left;  CDE:9.07    History   Social History  . Marital Status: Married    Spouse Name: N/A    Number of Children: N/A  . Years of Education: N/A   Occupational History  . Not on file.   Social History Main Topics  . Smoking  status: Former Smoker -- 1.00 packs/day for 30 years    Types: Cigarettes    Quit date: 03/01/1958  . Smokeless tobacco: Never Used  . Alcohol Use: No  . Drug Use: No  . Sexual Activity: No   Other Topics Concern  . Not on file   Social History Narrative   Retired, married.     Filed Vitals:   01/04/14 1055  BP: 127/65  Pulse: 69  Height: 5\' 10"  (1.778 m)  Weight: 180 lb 12.8 oz (82.01 kg)  SpO2: 94%    PHYSICAL EXAM General: NAD HEENT: Normal. Neck: No JVD, no thyromegaly. Lungs: Clear to auscultation bilaterally with normal respiratory effort. CV: Nondisplaced PMI.  Regular rate and rhythm, normal S1/S2, no S3/S4, no murmur. No pretibial or periankle edema.  No carotid bruit.  Normal pedal pulses.  Abdomen: Soft, nontender, no hepatosplenomegaly, no distention.  Neurologic: Alert and oriented x 3.  Psych: Normal affect. Skin: Normal. Musculoskeletal: Normal range of motion, no gross deformities. Extremities: No clubbing or cyanosis.   ECG: Most recent ECG reviewed.      ASSESSMENT AND PLAN:  CAD  Symptomatically stable on current medication regimen, with normal stress test in 09/2011. Continue ASA, metoprolol, amlodipine, fish oil, and Imdur.  Essential hypertension Well controlled on amlodipine 10 mg daily. No changes necessary.  Hyperlipidemia  He did not want to use statins. He takes fish oil.  Pulmonary hypertension  Wears oxygen sometimes during the day and at night. Monitors O2 sats regularly. Follows with Dr. Orson AloeHenderson.  Dispo: f/u 1 year.  Prentice DockerSuresh Timarie Labell, M.D., F.A.C.C.

## 2014-01-04 NOTE — Addendum Note (Signed)
Addended by: Lesle ChrisHILL, ANGELA G on: 01/04/2014 11:37 AM   Modules accepted: Level of Service

## 2014-10-07 ENCOUNTER — Other Ambulatory Visit: Payer: Self-pay | Admitting: *Deleted

## 2014-10-07 MED ORDER — NITROGLYCERIN 0.4 MG SL SUBL: 0.4000 mg | SUBLINGUAL_TABLET | SUBLINGUAL | Status: AC | PRN

## 2015-01-06 ENCOUNTER — Ambulatory Visit (INDEPENDENT_AMBULATORY_CARE_PROVIDER_SITE_OTHER): Payer: Medicare Other | Admitting: Cardiovascular Disease

## 2015-01-06 ENCOUNTER — Encounter: Payer: Self-pay | Admitting: Cardiovascular Disease

## 2015-01-06 VITALS — BP 128/70 | HR 68 | Ht 64.0 in | Wt 178.0 lb

## 2015-01-06 DIAGNOSIS — E785 Hyperlipidemia, unspecified: Secondary | ICD-10-CM | POA: Diagnosis not present

## 2015-01-06 DIAGNOSIS — I2510 Atherosclerotic heart disease of native coronary artery without angina pectoris: Secondary | ICD-10-CM

## 2015-01-06 DIAGNOSIS — I272 Other secondary pulmonary hypertension: Secondary | ICD-10-CM | POA: Diagnosis not present

## 2015-01-06 DIAGNOSIS — I10 Essential (primary) hypertension: Secondary | ICD-10-CM | POA: Diagnosis not present

## 2015-01-06 NOTE — Patient Instructions (Signed)
Continue all current medications. Your physician wants you to follow up in:  1 year.  You will receive a reminder letter in the mail one-two months in advance.  If you don't receive a letter, please call our office to schedule the follow up appointment   

## 2015-01-06 NOTE — Progress Notes (Signed)
Patient ID: Wayne [name], male   DOB: 02/16/1922, 81 y.o.   MRN: [id number]      SUBJECTIVE: Wayne [name] is a very pleasant 81 yr old man who is here to follow up for CAD. He underwent a normal nuclear stress test on 10-05-2011 with a calculated LVEF of 75%.  He denies exertional chest pain, palpitations, shortness of breath, and syncope. He again did not think he would be around to be at this visit when I told him at his last visit that I would see him in one year.   He wears oxygen at night and sometimes during the day when saturations fall below 90%. He carries an oxygen saturation monitor with him.  ECG today shows sinus rhythm with a diffuse nonspecific T wave abnormality.  Review of Systems: As per "subjective", otherwise negative.  Allergies  Allergen Reactions  . Codeine   . Cortisone     Current Outpatient Prescriptions  Medication Sig Dispense Refill  . albuterol (ACCUNEB) 1.25 MG/3ML nebulizer solution Take 1 ampule by nebulization 2 (two) times daily as needed for wheezing or shortness of breath.     . ALPRAZolam (XANAX) 0.5 MG tablet Take 0.25-0.5 mg by mouth at bedtime as needed. 1/2-1 tablet    . amLODipine (NORVASC) 10 MG tablet Take 1 tablet (10 mg total) by mouth daily. 90 tablet 3  . aspirin EC 81 MG tablet Take 81 mg by mouth daily.    . fluticasone (FLOVENT HFA) 220 MCG/ACT inhaler Inhale 1 puff into the lungs daily.     Marland Kitchen. HYDROcodone-acetaminophen (NORCO) 7.5-325 MG per tablet Take 1 tablet by mouth 4 (four) times daily.      . isosorbide mononitrate (IMDUR) 60 MG 24 hr tablet Take one-half by mouth daily  15 tablet 6  . metoprolol (LOPRESSOR) 50 MG tablet Take 25 mg by mouth 2 (two) times daily.    . Multiple Vitamin (MULTIVITAMIN) tablet Take 1 tablet by mouth daily.      . Multiple Vitamins-Minerals (OCUVITE PO) Take 1 tablet by mouth daily.    . nitroGLYCERIN (NITROSTAT) 0.4 MG SL tablet Place 1 tablet (0.4 mg total) under the tongue every 5 (five) minutes  x 3 doses as needed. 25 tablet 3  . NYSTATIN PO Take by mouth.    . OMEGA-3 KRILL OIL 300 MG CAPS Take 1 capsule by mouth daily. MEGA RED    . omeprazole (PRILOSEC) 20 MG capsule Take 20 mg by mouth daily.      . OXYGEN-HELIUM IN Inhale 2 L into the lungs as directed.    . polyethylene glycol powder (MIRALAX) powder Take 17 g by mouth daily. UAD     . pregabalin (LYRICA) 50 MG capsule Take 50 mg by mouth daily.    . Tamsulosin HCl (FLOMAX) 0.4 MG CAPS Take 0.4 mg by mouth daily.      . vitamin B-12 (CYANOCOBALAMIN) 1000 MCG tablet Take 1,000 mcg by mouth daily.       No current facility-administered medications for this visit.    Past Medical History  Diagnosis Date  . Other and unspecified hyperlipidemia     mixed  . Chest pain, unspecified   . Coronary atherosclerosis of native coronary artery     Status post stent RCA, 2004  . GERD (gastroesophageal reflux disease)   . Peripheral neuropathy (HCC)   . Chronic low back pain   . Ejection fraction     EF 65%, echo, July, 2013  . Pulmonary  hypertension (HCC)     Echo, July, 2013, right ventricular systolic pressure estimate 43 mmHg.  Marland Kitchen HTN (hypertension)   . Arthritis     Past Surgical History  Procedure Laterality Date  . Cholecystectomy    . Cataract extraction w/ intraocular lens implant Right   . Coronary angioplasty with stent placement  2004  . Cataract extraction w/phaco Left 12/25/2013    Procedure: CATARACT EXTRACTION PHACO AND INTRAOCULAR LENS PLACEMENT (IOC);  Surgeon: Loraine Leriche T. Nile Riggs, MD;  Location: [facility];  Service: Ophthalmology;  Laterality: Left;  CDE:9.07    Social History   Social History  . Marital Status: Married    Spouse Name: N/A  . Number of Children: N/A  . Years of Education: N/A   Occupational History  . Not on file.   Social History Main Topics  . Smoking status: Former Smoker -- 1.00 packs/day for 30 years    Types: Cigarettes    Quit date: 03/01/1958  . Smokeless tobacco: Never Used    . Alcohol Use: No  . Drug Use: No  . Sexual Activity: No   Other Topics Concern  . Not on file   Social History Narrative   Retired, married.     Filed Vitals:   01/06/15 1406  BP: 128/70  Pulse: 68  Height:  (1.626 m)  Weight: 178 lb (80.74 kg)  SpO2: 91%    PHYSICAL EXAM General: NAD HEENT: Normal. Neck: No JVD, no thyromegaly. Lungs: Clear to auscultation bilaterally with normal respiratory effort. CV: Nondisplaced PMI.  Regular rate and rhythm, normal S1/S2, no S3/S4, no murmur. No pretibial or periankle edema.  No carotid bruit.   Stasis dermatitis on legs b/l.  Abdomen: Soft, nontender, no distention.  Neurologic: Alert and oriented x 3.  Psych: Normal affect. Skin: Normal. Musculoskeletal: No gross deformities. Extremities: No clubbing or cyanosis.   ECG: Most recent ECG reviewed.      ASSESSMENT AND PLAN: CAD  Symptomatically stable on current medication regimen, with normal stress test in 09/2011. Continue ASA, metoprolol, amlodipine, fish oil, and Imdur.  Essential hypertension Well controlled on amlodipine 10 mg daily. No changes necessary.  Hyperlipidemia  He does not want to use statins. He takes fish oil.  Pulmonary hypertension  Wears oxygen sometimes during the day and at night. Monitors O2 sats regularly. Follows with pulmonary.  Dispo: f/u 1 year.   Prentice Docker, M.D., F.A.C.C.

## 2016-01-15 ENCOUNTER — Ambulatory Visit: Payer: Medicare Other | Admitting: Cardiovascular Disease

## 2017-01-04 ENCOUNTER — Ambulatory Visit: Payer: Medicare Other | Admitting: Cardiovascular Disease

## 2017-01-04 ENCOUNTER — Encounter: Payer: Self-pay | Admitting: Cardiovascular Disease

## 2017-01-04 VITALS — BP 118/68 | HR 82 | Ht 70.0 in | Wt 157.0 lb

## 2017-01-04 DIAGNOSIS — I25118 Atherosclerotic heart disease of native coronary artery with other forms of angina pectoris: Secondary | ICD-10-CM | POA: Diagnosis not present

## 2017-01-04 DIAGNOSIS — R0789 Other chest pain: Secondary | ICD-10-CM

## 2017-01-04 DIAGNOSIS — I10 Essential (primary) hypertension: Secondary | ICD-10-CM

## 2017-01-04 DIAGNOSIS — Z955 Presence of coronary angioplasty implant and graft: Secondary | ICD-10-CM | POA: Diagnosis not present

## 2017-01-04 DIAGNOSIS — R0609 Other forms of dyspnea: Secondary | ICD-10-CM

## 2017-01-04 DIAGNOSIS — Z9289 Personal history of other medical treatment: Secondary | ICD-10-CM | POA: Diagnosis not present

## 2017-01-04 MED ORDER — METOPROLOL TARTRATE 50 MG PO TABS: 50.0000 mg | ORAL_TABLET | Freq: Two times a day (BID) | ORAL | 6 refills | Status: DC

## 2017-01-04 NOTE — Patient Instructions (Signed)
Medication Instructions:   Increase Lopressor to 50mg  twice a day.  Continue all other medications.    Labwork: none  Testing/Procedures: none  Follow-Up: 6 weeks   Any Other Special Instructions Will Be Listed Below (If Applicable).  If you need a refill on your cardiac medications before your next appointment, please call your pharmacy.

## 2017-01-04 NOTE — Progress Notes (Signed)
SUBJECTIVE: The patient presents for past due follow-up.  I have not seen him in 2 years.  He is a 83 year old man with coronary artery disease with an RCA stent placed in 2004. He underwent a normal nuclear stress test on 10-05-2011 with a calculated LVEF of 75%.   7 for the past 6 months, he has been awakening with chest tightness.  He said if he takes a few deep breaths the symptoms go away and he has no further chest tightness throughout the day.  He denies having to use nitroglycerin.  He has been experiencing progressive exertional dyspnea over the last several months.  His wife has been in a nursing home for the past 4 years.  He was apparently hospitalized for back pain about 2 months ago.  I am trying to obtain these records.  ECG performed the office today which are personally interpreted demonstrated sinus rhythm with PVCs.  There were nonspecific T wave abnormalities in lead III.    Review of Systems: As per "subjective", otherwise negative.  Allergies  Allergen Reactions  . Codeine   . Cortisone     Current Outpatient Medications  Medication Sig Dispense Refill  . albuterol (ACCUNEB) 1.25 MG/3ML nebulizer solution Take 1 ampule by nebulization 2 (two) times daily as needed for wheezing or shortness of breath.     Marland Kitchen. amLODipine (NORVASC) 10 MG tablet Take 1 tablet (10 mg total) by mouth daily. 90 tablet 3  . aspirin EC 81 MG tablet Take 81 mg by mouth daily.    Marland Kitchen. HYDROcodone-acetaminophen (NORCO) 7.5-325 MG per tablet Take 1 tablet by mouth 4 (four) times daily.      . isosorbide mononitrate (IMDUR) 60 MG 24 hr tablet Take one-half by mouth daily  15 tablet 6  . metoprolol (LOPRESSOR) 50 MG tablet Take 25 mg by mouth 2 (two) times daily.    . Multiple Vitamin (MULTIVITAMIN) tablet Take 1 tablet by mouth daily.      . Multiple Vitamins-Minerals (OCUVITE PO) Take 1 tablet by mouth daily.    . nitroGLYCERIN (NITROSTAT) 0.4 MG SL tablet Place 1 tablet (0.4 mg total) under  the tongue every 5 (five) minutes x 3 doses as needed. 25 tablet 3  . NYSTATIN PO Take by mouth.    . OMEGA-3 KRILL OIL 300 MG CAPS Take 1 capsule by mouth daily. MEGA RED    . OXYGEN-HELIUM IN Inhale 2 L into the lungs as directed.    . polyethylene glycol powder (MIRALAX) powder Take 17 g by mouth daily. UAD     . pravastatin (PRAVACHOL) 10 MG tablet Take 10 mg daily by mouth.    . pregabalin (LYRICA) 75 MG capsule Take 75 mg daily by mouth.    . Tamsulosin HCl (FLOMAX) 0.4 MG CAPS Take 0.4 mg by mouth daily.      . vitamin B-12 (CYANOCOBALAMIN) 1000 MCG tablet Take 1,000 mcg by mouth daily.      . fluticasone (FLOVENT HFA) 220 MCG/ACT inhaler Inhale 1 puff into the lungs daily.      No current facility-administered medications for this visit.     Past Medical History:  Diagnosis Date  . Arthritis   . Chest pain, unspecified   . Chronic low back pain   . Coronary atherosclerosis of native coronary artery    Status post stent RCA, 2004  . Ejection fraction    EF 65%, echo, July, 2013  . GERD (gastroesophageal reflux disease)   .  HTN (hypertension)   . Other and unspecified hyperlipidemia    mixed  . Peripheral neuropathy   . Pulmonary hypertension (HCC)    Echo, July, 2013, right ventricular systolic pressure estimate 43 mmHg.    Past Surgical History:  Procedure Laterality Date  . CATARACT EXTRACTION W/ INTRAOCULAR LENS IMPLANT Right   . CHOLECYSTECTOMY    . CORONARY ANGIOPLASTY WITH STENT PLACEMENT  2004    Social History   Socioeconomic History  . Marital status: Married    Spouse name: Not on file  . Number of children: Not on file  . Years of education: Not on file  . Highest education level: Not on file  Social Needs  . Financial resource strain: Not on file  . Food insecurity - worry: Not on file  . Food insecurity - inability: Not on file  . Transportation needs - medical: Not on file  . Transportation needs - non-medical: Not on file  Occupational  History  . Not on file  Tobacco Use  . Smoking status: Former Smoker    Packs/day: 1.00    Years: 30.00    Pack years: 30.00    Types: Cigarettes    Last attempt to quit: 03/01/1958    Years since quitting: 58.8  . Smokeless tobacco: Never Used  Substance and Sexual Activity  . Alcohol use: No    Alcohol/week: 0.0 oz  . Drug use: No  . Sexual activity: No  Other Topics Concern  . Not on file  Social History Narrative   Retired, married.     Vitals:   01/04/17 1447  BP: 118/68  Pulse: 82  SpO2: (!) 85%  Weight: 157 lb (71.2 kg)  Height: 5\' 10"  (1.778 m)    Wt Readings from Last 3 Encounters:  01/04/17 157 lb (71.2 kg)  01/06/15 178 lb (80.7 kg)  01/04/14 180 lb 12.8 oz (82 kg)     PHYSICAL EXAM General: NAD, using oxygen by nasal cannula HEENT: Normal. Neck: No JVD, no thyromegaly. Lungs: Clear to auscultation bilaterally with normal respiratory effort. CV: Regular rate and rhythm, normal S1/S2, no S3/S4, no murmur. No pretibial or periankle edema. Abdomen: Soft, nontender, no distention.  Neurologic: Alert and oriented.  Psych: Normal affect. Skin: Normal. Musculoskeletal: No gross deformities.    ECG: Most recent ECG reviewed.   Labs: Lab Results  Component Value Date/Time   K 4.6 12/20/2013 01:00 PM   BUN 22 12/20/2013 01:00 PM   CREATININE 1.04 12/20/2013 01:00 PM   HGB 13.5 12/20/2013 01:00 PM     Lipids: No results found for: LDLCALC, LDLDIRECT, CHOL, TRIG, HDL     ASSESSMENT AND PLAN: 1.  Coronary artery disease with chest tightness and increasing exertional dyspnea: He is currently taking aspirin, Imdur 30 mg, and metoprolol 25 mg twice daily.  I will increase metoprolol to 50 mg twice daily.  If symptoms continue at the time of his next visit, I would consider increasing Imdur to 60 mg daily.  I may consider stress testing in the future.  2.  Hypertension: Blood pressure is controlled.  I will monitor given increase of metoprolol to 50 mg  twice daily.    Disposition: Follow up 6 weeks   Prentice DockerSuresh Shakara Tweedy, M.D., F.A.C.C.

## 2017-01-06 ENCOUNTER — Encounter: Payer: Self-pay | Admitting: *Deleted

## 2017-02-18 ENCOUNTER — Encounter: Payer: Self-pay | Admitting: *Deleted

## 2017-02-21 ENCOUNTER — Encounter: Payer: Self-pay | Admitting: Cardiovascular Disease

## 2017-02-21 ENCOUNTER — Ambulatory Visit: Payer: Medicare Other | Admitting: Cardiovascular Disease

## 2017-02-21 VITALS — BP 108/60 | HR 75 | Ht 70.0 in | Wt 156.0 lb

## 2017-02-21 DIAGNOSIS — Z955 Presence of coronary angioplasty implant and graft: Secondary | ICD-10-CM

## 2017-02-21 DIAGNOSIS — R0789 Other chest pain: Secondary | ICD-10-CM | POA: Diagnosis not present

## 2017-02-21 DIAGNOSIS — I25118 Atherosclerotic heart disease of native coronary artery with other forms of angina pectoris: Secondary | ICD-10-CM

## 2017-02-21 DIAGNOSIS — I10 Essential (primary) hypertension: Secondary | ICD-10-CM

## 2017-02-21 DIAGNOSIS — R0609 Other forms of dyspnea: Secondary | ICD-10-CM

## 2017-02-21 NOTE — Progress Notes (Signed)
SUBJECTIVE: The patient presents for follow-up of coronary artery disease.  He had been complaining of chest tightness and increasing exertional dyspnea at his last visit with me on November 6.  I increased metoprolol at that time to 50 mg twice daily.  He is a 83 year old man with coronary artery disease with an RCA stent placed in 2004. He underwent a normal nuclear stress test on 10-05-2011 with a calculated LVEF of 75%.   He said he felt better after I increase the dose of metoprolol at his last visit.  He denies chest pain.  He has some chronic exertional dyspnea which is stable.  His wife of 73 years is in a nursing home, and it costs him $7000 per month.  She has dementia.    Review of Systems: As per "subjective", otherwise negative.  Allergies  Allergen Reactions  . Codeine   . Cortisone     Current Outpatient Medications  Medication Sig Dispense Refill  . albuterol (ACCUNEB) 1.25 MG/3ML nebulizer solution Take 1 ampule by nebulization 2 (two) times daily as needed for wheezing or shortness of breath.     Marland Kitchen. amLODipine (NORVASC) 10 MG tablet Take 1 tablet (10 mg total) by mouth daily. 90 tablet 3  . aspirin EC 81 MG tablet Take 81 mg by mouth daily.    . fluticasone (FLOVENT HFA) 220 MCG/ACT inhaler Inhale 1 puff into the lungs daily.     Marland Kitchen. HYDROcodone-acetaminophen (NORCO) 7.5-325 MG per tablet Take 1 tablet by mouth 4 (four) times daily.      . isosorbide mononitrate (IMDUR) 60 MG 24 hr tablet Take one-half by mouth daily  15 tablet 6  . metoprolol tartrate (LOPRESSOR) 50 MG tablet Take 1 tablet (50 mg total) 2 (two) times daily by mouth. 60 tablet 6  . Multiple Vitamin (MULTIVITAMIN) tablet Take 1 tablet by mouth daily.      . Multiple Vitamins-Minerals (OCUVITE PO) Take 1 tablet by mouth daily.    . nitroGLYCERIN (NITROSTAT) 0.4 MG SL tablet Place 1 tablet (0.4 mg total) under the tongue every 5 (five) minutes x 3 doses as needed. 25 tablet 3  . NYSTATIN PO Take by  mouth.    . OMEGA-3 KRILL OIL 300 MG CAPS Take 1 capsule by mouth daily. MEGA RED    . OXYGEN-HELIUM IN Inhale 2 L into the lungs as directed.    . polyethylene glycol powder (MIRALAX) powder Take 17 g by mouth daily. UAD     . pravastatin (PRAVACHOL) 10 MG tablet Take 10 mg daily by mouth.    . pregabalin (LYRICA) 75 MG capsule Take 75 mg daily by mouth.    . Tamsulosin HCl (FLOMAX) 0.4 MG CAPS Take 0.4 mg by mouth daily.      . vitamin B-12 (CYANOCOBALAMIN) 1000 MCG tablet Take 1,000 mcg by mouth daily.       No current facility-administered medications for this visit.     Past Medical History:  Diagnosis Date  . Arthritis   . Chest pain, unspecified   . Chronic low back pain   . Coronary atherosclerosis of native coronary artery    Status post stent RCA, 2004  . Ejection fraction    EF 65%, echo, July, 2013  . GERD (gastroesophageal reflux disease)   . HTN (hypertension)   . Other and unspecified hyperlipidemia    mixed  . Peripheral neuropathy   . Pulmonary hypertension (HCC)    Echo, July, 2013, right ventricular  systolic pressure estimate 43 mmHg.    Past Surgical History:  Procedure Laterality Date  . CATARACT EXTRACTION W/ INTRAOCULAR LENS IMPLANT Right   . CATARACT EXTRACTION W/PHACO Left 12/25/2013   Procedure: CATARACT EXTRACTION PHACO AND INTRAOCULAR LENS PLACEMENT (IOC);  Surgeon: Loraine LericheMark T. Nile RiggsShapiro, MD;  Location: [facility];  Service: Ophthalmology;  Laterality: Left;  CDE:9.07  . CHOLECYSTECTOMY    . CORONARY ANGIOPLASTY WITH STENT PLACEMENT  2004    Social History   Socioeconomic History  . Marital status: Married    Spouse name: Not on file  . Number of children: Not on file  . Years of education: Not on file  . Highest education level: Not on file  Social Needs  . Financial resource strain: Not on file  . Food insecurity - worry: Not on file  . Food insecurity - inability: Not on file  . Transportation needs - medical: Not on file  . Transportation  needs - non-medical: Not on file  Occupational History  . Not on file  Tobacco Use  . Smoking status: Former Smoker    Packs/day: 1.00    Years: 30.00    Pack years: 30.00    Types: Cigarettes    Last attempt to quit: 03/01/1958    Years since quitting: 59.0  . Smokeless tobacco: Never Used  Substance and Sexual Activity  . Alcohol use: No    Alcohol/week: 0.0 oz  . Drug use: No  . Sexual activity: No  Other Topics Concern  . Not on file  Social History Narrative   Retired, married.     Vitals:   02/21/17 0940  BP: 108/60  Pulse: 75  Weight: 156 lb (70.8 kg)  Height: 5\' 10"  (1.778 m)    Wt Readings from Last 3 Encounters:  02/21/17 156 lb (70.8 kg)  01/04/17 157 lb (71.2 kg)  01/06/15 178 lb (80.7 kg)     PHYSICAL EXAM General: NAD HEENT: Normal. Neck: No JVD, no thyromegaly. Lungs: Clear to auscultation bilaterally with normal respiratory effort. CV: Regular rate and rhythm, normal S1/S2, no S3/S4, no murmur. No pretibial or periankle edema. Abdomen: Soft, nontender, no distention.  Neurologic: Alert and oriented.  Psych: Normal affect. Skin: Normal. Musculoskeletal: No gross deformities.    ECG: Most recent ECG reviewed.   Labs: Lab Results  Component Value Date/Time   K 4.6 12/20/2013 01:00 PM   BUN 22 12/20/2013 01:00 PM   CREATININE 1.04 12/20/2013 01:00 PM   HGB 13.5 12/20/2013 01:00 PM     Lipids: No results found for: LDLCALC, LDLDIRECT, CHOL, TRIG, HDL     ASSESSMENT AND PLAN:  1.  Coronary artery disease with chest tightness and increasing exertional dyspnea: Symptomatically improved with increase of metoprolol to 50 mg twice daily.  Continue aspirin and isosorbide mononitrate 30 mg.  I recommended he follow-up with me in 3 months but he asked if he could do 6 months due to cost for reasons mentioned above.  2.  Hypertension: Blood pressure is controlled.  No changes to therapy.     Disposition: Follow up 6 months   Prentice DockerSuresh  Koneswaran, M.D., F.A.C.C.

## 2017-02-21 NOTE — Patient Instructions (Signed)

## 2017-08-01 ENCOUNTER — Encounter: Payer: Self-pay | Admitting: *Deleted

## 2017-08-01 ENCOUNTER — Ambulatory Visit: Payer: Medicare Other | Admitting: Cardiovascular Disease

## 2017-08-01 ENCOUNTER — Encounter: Payer: Self-pay | Admitting: Cardiovascular Disease

## 2017-08-01 ENCOUNTER — Telehealth: Payer: Self-pay | Admitting: Cardiovascular Disease

## 2017-08-01 VITALS — BP 122/62 | HR 75 | Ht 70.0 in | Wt 145.0 lb

## 2017-08-01 DIAGNOSIS — I10 Essential (primary) hypertension: Secondary | ICD-10-CM | POA: Diagnosis not present

## 2017-08-01 DIAGNOSIS — I25118 Atherosclerotic heart disease of native coronary artery with other forms of angina pectoris: Secondary | ICD-10-CM | POA: Diagnosis not present

## 2017-08-01 DIAGNOSIS — R0609 Other forms of dyspnea: Secondary | ICD-10-CM

## 2017-08-01 DIAGNOSIS — Z955 Presence of coronary angioplasty implant and graft: Secondary | ICD-10-CM

## 2017-08-01 MED ORDER — ISOSORBIDE MONONITRATE ER 60 MG PO TB24: 60.0000 mg | ORAL_TABLET | Freq: Every day | ORAL | 6 refills | Status: DC

## 2017-08-01 NOTE — Progress Notes (Signed)
SUBJECTIVE: The patient presents for routine follow-up of coronary artery disease.  He had an RCA stent placed in 2004. He underwent a normal nuclear stress test on 10-05-2011 with a calculated LVEF of 75%.  He denies chest pain.  However, he has been experiencing increasing exertional dyspnea over the last few months  He told me his PCP obtain an x-ray which was "clear ".  He has chronic lower extremity edema which is stable.  He is currently on Imdur 30 mg daily.  He is using oxygen.  He is here with his son, Deniece PortelaWayne.     Review of Systems: As per "subjective", otherwise negative.  Allergies  Allergen Reactions  . Codeine   . Cortisone     Current Outpatient Medications  Medication Sig Dispense Refill  . albuterol (ACCUNEB) 1.25 MG/3ML nebulizer solution Take 1 ampule by nebulization 2 (two) times daily as needed for wheezing or shortness of breath.     Marland Kitchen. amLODipine (NORVASC) 10 MG tablet Take 1 tablet (10 mg total) by mouth daily. 90 tablet 3  . aspirin EC 81 MG tablet Take 81 mg by mouth daily.    . fluticasone (FLOVENT HFA) 220 MCG/ACT inhaler Inhale 1 puff into the lungs daily.     Marland Kitchen. HYDROcodone-acetaminophen (NORCO) 7.5-325 MG per tablet Take 1 tablet by mouth 4 (four) times daily.      . isosorbide mononitrate (IMDUR) 60 MG 24 hr tablet Take one-half by mouth daily  15 tablet 6  . metoprolol tartrate (LOPRESSOR) 50 MG tablet Take 1 tablet (50 mg total) 2 (two) times daily by mouth. 60 tablet 6  . Multiple Vitamin (MULTIVITAMIN) tablet Take 1 tablet by mouth daily.      . Multiple Vitamins-Minerals (OCUVITE PO) Take 1 tablet by mouth daily.    . nitroGLYCERIN (NITROSTAT) 0.4 MG SL tablet Place 1 tablet (0.4 mg total) under the tongue every 5 (five) minutes x 3 doses as needed. 25 tablet 3  . NYSTATIN PO Take by mouth.    . OMEGA-3 KRILL OIL 300 MG CAPS Take 1 capsule by mouth daily. MEGA RED    . OXYGEN-HELIUM IN Inhale 2 L into the lungs as directed.    . polyethylene  glycol powder (MIRALAX) powder Take 17 g by mouth daily. UAD     . pravastatin (PRAVACHOL) 10 MG tablet Take 10 mg daily by mouth.    . pregabalin (LYRICA) 75 MG capsule Take 75 mg daily by mouth.    . Tamsulosin HCl (FLOMAX) 0.4 MG CAPS Take 0.4 mg by mouth daily.      . vitamin B-12 (CYANOCOBALAMIN) 1000 MCG tablet Take 1,000 mcg by mouth daily.       No current facility-administered medications for this visit.     Past Medical History:  Diagnosis Date  . Arthritis   . Chest pain, unspecified   . Chronic low back pain   . Coronary atherosclerosis of native coronary artery    Status post stent RCA, 2004  . Ejection fraction    EF 65%, echo, July, 2013  . GERD (gastroesophageal reflux disease)   . HTN (hypertension)   . Other and unspecified hyperlipidemia    mixed  . Peripheral neuropathy   . Pulmonary hypertension (HCC)    Echo, July, 2013, right ventricular systolic pressure estimate 43 mmHg.    Past Surgical History:  Procedure Laterality Date  . CATARACT EXTRACTION W/ INTRAOCULAR LENS IMPLANT Right   . CATARACT EXTRACTION W/PHACO  Left 12/25/2013   Procedure: CATARACT EXTRACTION PHACO AND INTRAOCULAR LENS PLACEMENT (IOC);  Surgeon: Loraine Leriche T. Nile Riggs, MD;  Location: [facility];  Service: Ophthalmology;  Laterality: Left;  CDE:9.07  . CHOLECYSTECTOMY    . CORONARY ANGIOPLASTY WITH STENT PLACEMENT  2004    Social History   Socioeconomic History  . Marital status: Married    Spouse name: Not on file  . Number of children: Not on file  . Years of education: Not on file  . Highest education level: Not on file  Occupational History  . Not on file  Social Needs  . Financial resource strain: Not on file  . Food insecurity:    Worry: Not on file    Inability: Not on file  . Transportation needs:    Medical: Not on file    Non-medical: Not on file  Tobacco Use  . Smoking status: Former Smoker    Packs/day: 1.00    Years: 30.00    Pack years: 30.00    Types: Cigarettes     Last attempt to quit: 03/01/1958    Years since quitting: 59.4  . Smokeless tobacco: Never Used  Substance and Sexual Activity  . Alcohol use: No    Alcohol/week: 0.0 oz  . Drug use: No  . Sexual activity: Never  Lifestyle  . Physical activity:    Days per week: Not on file    Minutes per session: Not on file  . Stress: Not on file  Relationships  . Social connections:    Talks on phone: Not on file    Gets together: Not on file    Attends religious service: Not on file    Active member of club or organization: Not on file    Attends meetings of clubs or organizations: Not on file    Relationship status: Not on file  . Intimate partner violence:    Fear of current or ex partner: Not on file    Emotionally abused: Not on file    Physically abused: Not on file    Forced sexual activity: Not on file  Other Topics Concern  . Not on file  Social History Narrative   Retired, married.     Vitals:   08/01/17 1400  BP: 122/62  Pulse: 75  Weight: 145 lb (65.8 kg)  Height: 5\' 10"  (1.778 m)    Wt Readings from Last 3 Encounters:  08/01/17 145 lb (65.8 kg)  02/21/17 156 lb (70.8 kg)  01/04/17 157 lb (71.2 kg)     PHYSICAL EXAM General: NAD HEENT: Normal. Neck: No JVD, no thyromegaly. Lungs: Clear to auscultation bilaterally with normal respiratory effort. CV: Regular rate and rhythm, normal S1/S2, no S3/S4, no murmur. No pretibial or periankle edema.  No carotid bruit.   Abdomen: Soft, nontender, no distention.  Neurologic: Alert and oriented.  Psych: Normal affect. Skin: Normal. Musculoskeletal: No gross deformities.    ECG: Most recent ECG reviewed.   Labs: Lab Results  Component Value Date/Time   K 4.6 12/20/2013 01:00 PM   BUN 22 12/20/2013 01:00 PM   CREATININE 1.04 12/20/2013 01:00 PM   HGB 13.5 12/20/2013 01:00 PM     Lipids: No results found for: LDLCALC, LDLDIRECT, CHOL, TRIG, HDL     ASSESSMENT AND PLAN: 1. Coronary artery disease with  increasing exertional dyspnea:  Symptoms initially improved when I increased metoprolol to 50 mill grams twice daily but they have recurred.  He is currently on aspirin and Imdur 30 mg  daily.  I will increase Imdur to 60 mg daily.  I will obtain a Lexiscan Myoview to evaluate for ischemia given that he has an RCA stent placed in 2004.  I will also obtain a copy of the chest x-ray and ECG obtained by his PCP.  2. Hypertension: Blood pressure is controlled.    I will monitor given increased dose of Imdur as noted above.      Disposition: Follow up 6-8 weeks   Prentice Docker, M.D., F.A.C.C.

## 2017-08-01 NOTE — Telephone Encounter (Signed)
August 09[facility]

## 2017-08-01 NOTE — Patient Instructions (Signed)
Medication Instructions:   Increase Indur to 60mg  daily.   Continue all other medications.    Labwork: none  Testing/Procedures:  Your physician has requested that you have a lexiscan myoview. For further information please visit [email or web address]. Please follow instruction sheet, as given.  Office will contact with results via phone or letter.    Follow-Up: 6-8 weeks  Any Other Special Instructions Will Be Listed Below (If Applicable).  If you need a refill on your cardiac medications before your next appointment, please call your pharmacy.

## 2017-08-09 ENCOUNTER — Encounter (HOSPITAL_BASED_OUTPATIENT_CLINIC_OR_DEPARTMENT_OTHER)
Admission: RE | Admit: 2017-08-09 | Discharge: 2017-08-09 | Disposition: A | Discharge status: Home / Self Care | Payer: Medicare Other | Source: Ambulatory Visit | Attending: Cardiovascular Disease | Admitting: Cardiovascular Disease

## 2017-08-09 ENCOUNTER — Ambulatory Visit (HOSPITAL_COMMUNITY)
Admission: RE | Admit: 2017-08-09 | Discharge: 2017-08-09 | Disposition: A | Discharge status: Home / Self Care | Payer: Medicare Other | Source: Ambulatory Visit | Attending: Internal Medicine | Admitting: Internal Medicine

## 2017-08-09 DIAGNOSIS — R0609 Other forms of dyspnea: Secondary | ICD-10-CM | POA: Insufficient documentation

## 2017-08-09 LAB — NM MYOCAR MULTI W/SPECT W/WALL MOTION / EF
CHL CUP NUCLEAR SRS: 1
CSEPPHR: 80 {beats}/min
LV dias vol: 84 mL (ref 62–150)
LVSYSVOL: 22 mL
RATE: 0.43
Rest HR: 68 {beats}/min
SDS: 3
SSS: 3
TID: 0.95

## 2017-08-09 MED ORDER — TECHNETIUM TC 99M TETROFOSMIN IV KIT
30.0000 | PACK | Freq: Once | INTRAVENOUS | Status: AC | PRN
  Administered 2017-08-09: 30 via INTRAVENOUS

## 2017-08-09 MED ORDER — TECHNETIUM TC 99M TETROFOSMIN IV KIT
10.0000 | PACK | Freq: Once | INTRAVENOUS | Status: AC | PRN
  Administered 2017-08-09: 9.7 via INTRAVENOUS

## 2017-08-09 MED ORDER — SODIUM CHLORIDE 0.9% FLUSH
INTRAVENOUS | Status: AC
  Administered 2017-08-09: 10 mL via INTRAVENOUS
  Filled 2017-08-09: qty 10

## 2017-08-09 MED ORDER — REGADENOSON 0.4 MG/5ML IV SOLN
INTRAVENOUS | Status: AC
  Administered 2017-08-09: 0.4 mg via INTRAVENOUS
  Filled 2017-08-09: qty 5

## 2017-08-11 ENCOUNTER — Telehealth: Payer: Self-pay | Admitting: *Deleted

## 2017-08-11 NOTE — Telephone Encounter (Signed)
Notes recorded by Lesle ChrisHill, Angela G, LPN on 1/30/86576/13/2019 at 1:43 PM EDT Patient notified. Copy to pmd. Follow up scheduled for 09/28/2017 with Dr. Purvis SheffieldKoneswaran in [place] office.   ------  Notes recorded by Lesle ChrisHill, Angela G, LPN on 8/46/96296/13/2019 at 10:39 AM EDT Left message to return call.  ------  Notes recorded by Laqueta LindenKoneswaran, Suresh A, MD on 08/09/2017 at 11:51 PM EDT Low risk for cardiac events. Will discuss further at office visit.

## 2017-09-28 ENCOUNTER — Encounter: Payer: Self-pay | Admitting: *Deleted

## 2017-09-28 ENCOUNTER — Ambulatory Visit: Payer: Medicare Other | Admitting: Cardiovascular Disease

## 2017-09-28 VITALS — BP 126/56 | HR 67 | Ht 70.0 in | Wt 145.0 lb

## 2017-09-28 DIAGNOSIS — Z955 Presence of coronary angioplasty implant and graft: Secondary | ICD-10-CM

## 2017-09-28 DIAGNOSIS — I25118 Atherosclerotic heart disease of native coronary artery with other forms of angina pectoris: Secondary | ICD-10-CM | POA: Diagnosis not present

## 2017-09-28 DIAGNOSIS — I10 Essential (primary) hypertension: Secondary | ICD-10-CM

## 2017-09-28 DIAGNOSIS — R0789 Other chest pain: Secondary | ICD-10-CM

## 2017-09-28 MED ORDER — METOPROLOL TARTRATE 50 MG PO TABS: 50.0000 mg | ORAL_TABLET | Freq: Two times a day (BID) | ORAL | 4 refills | Status: DC

## 2017-09-28 MED ORDER — METOPROLOL TARTRATE 25 MG PO TABS: 25.0000 mg | ORAL_TABLET | Freq: Two times a day (BID) | ORAL | 3 refills | Status: DC

## 2017-09-28 NOTE — Progress Notes (Signed)
SUBJECTIVE: The patient presents for routine follow-up.  He underwent a low risk stress test on 08/09/2017.  There were T wave inversions in leads III and aVF seen throughout the study.  There were defects that appeared to be due to variable soft tissue attenuation although a mild degree of ischemia cannot entirely be ruled out.  He underwent right coronary artery stent placement in 2004.  The increase of Imdur to 60 mg has helped to alleviate his chest pain.  Chronic exertional dyspnea is stable.  It is made worse with the hot and humid weather we have recently been experiencing.  He is here with his son, Deniece Portela, who is a Market researcher in [place].  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  He is a Buyer, retail of [employer].  He has another son who is 8 and is undergoing surgery for bladder cancer at [facility] tomorrow.  Review of Systems: As per "subjective", otherwise negative.  Allergies  Allergen Reactions  . Codeine   . Cortisone     Current Outpatient Medications  Medication Sig Dispense Refill  . albuterol (ACCUNEB) 1.25 MG/3ML nebulizer solution Take 1 ampule by nebulization 2 (two) times daily as needed for wheezing or shortness of breath.     Marland Kitchen amLODipine (NORVASC) 10 MG tablet Take 1 tablet (10 mg total) by mouth daily. 90 tablet 3  . aspirin EC 81 MG tablet Take 81 mg by mouth daily.    . fluticasone (FLOVENT HFA) 220 MCG/ACT inhaler Inhale 1 puff into the lungs daily.     Marland Kitchen HYDROcodone-acetaminophen (NORCO) 7.5-325 MG per tablet Take 1 tablet by mouth 4 (four) times daily.      . isosorbide mononitrate (IMDUR) 60 MG 24 hr tablet Take 1 tablet (60 mg total) by mouth daily. 30 tablet 6  . metoprolol tartrate (LOPRESSOR) 50 MG tablet Take 1 tablet (50 mg total) by mouth 2 (two) times daily. 180 tablet 4  . Multiple Vitamin (MULTIVITAMIN) tablet Take 1 tablet by mouth daily.      . Multiple Vitamins-Minerals (OCUVITE PO) Take 1 tablet by mouth  daily.    . nitroGLYCERIN (NITROSTAT) 0.4 MG SL tablet Place 1 tablet (0.4 mg total) under the tongue every 5 (five) minutes x 3 doses as needed. 25 tablet 3  . NYSTATIN PO Take by mouth.    . OMEGA-3 KRILL OIL 300 MG CAPS Take 1 capsule by mouth daily. MEGA RED    . OXYGEN-HELIUM IN Inhale 2 L into the lungs as directed.    . polyethylene glycol powder (MIRALAX) powder Take 17 g by mouth daily. UAD     . pravastatin (PRAVACHOL) 10 MG tablet Take 10 mg daily by mouth.    . pregabalin (LYRICA) 75 MG capsule Take 75 mg daily by mouth.    . Tamsulosin HCl (FLOMAX) 0.4 MG CAPS Take 0.4 mg by mouth daily.      . vitamin B-12 (CYANOCOBALAMIN) 1000 MCG tablet Take 1,000 mcg by mouth daily.       No current facility-administered medications for this visit.     Past Medical History:  Diagnosis Date  . Arthritis   . Chest pain, unspecified   . Chronic low back pain   . Coronary atherosclerosis of native coronary artery    Status post stent RCA, 2004  . Ejection fraction    EF 65%, echo, July, 2013  . GERD (gastroesophageal reflux disease)   .  HTN (hypertension)   . Other and unspecified hyperlipidemia    mixed  . Peripheral neuropathy   . Pulmonary hypertension (HCC)    Echo, July, 2013, right ventricular systolic pressure estimate 43 mmHg.    Past Surgical History:  Procedure Laterality Date  . CATARACT EXTRACTION W/ INTRAOCULAR LENS IMPLANT Right   . CATARACT EXTRACTION W/PHACO Left 12/25/2013   Procedure: CATARACT EXTRACTION PHACO AND INTRAOCULAR LENS PLACEMENT (IOC);  Surgeon: Loraine LericheMark T. Nile RiggsShapiro, MD;  Location: [facility];  Service: Ophthalmology;  Laterality: Left;  CDE:9.07  . CHOLECYSTECTOMY    . CORONARY ANGIOPLASTY WITH STENT PLACEMENT  2004    Social History   Socioeconomic History  . Marital status: Married    Spouse name: Not on file  . Number of children: Not on file  . Years of education: Not on file  . Highest education level: Not on file  Occupational History  . Not  on file  Social Needs  . Financial resource strain: Not on file  . Food insecurity:    Worry: Not on file    Inability: Not on file  . Transportation needs:    Medical: Not on file    Non-medical: Not on file  Tobacco Use  . Smoking status: Former Smoker    Packs/day: 1.00    Years: 30.00    Pack years: 30.00    Types: Cigarettes    Last attempt to quit: 03/01/1958    Years since quitting: 59.6  . Smokeless tobacco: Never Used  Substance and Sexual Activity  . Alcohol use: No    Alcohol/week: 0.0 oz  . Drug use: No  . Sexual activity: Never  Lifestyle  . Physical activity:    Days per week: Not on file    Minutes per session: Not on file  . Stress: Not on file  Relationships  . Social connections:    Talks on phone: Not on file    Gets together: Not on file    Attends religious service: Not on file    Active member of club or organization: Not on file    Attends meetings of clubs or organizations: Not on file    Relationship status: Not on file  . Intimate partner violence:    Fear of current or ex partner: Not on file    Emotionally abused: Not on file    Physically abused: Not on file    Forced sexual activity: Not on file  Other Topics Concern  . Not on file  Social History Narrative   Retired, married.     Vitals:   09/28/17 1237  BP: (!) 126/56  Pulse: 67  SpO2: (!) 89%  Weight: 145 lb (65.8 kg)  Height: 5\' 10"  (1.778 m)    Wt Readings from Last 3 Encounters:  09/28/17 145 lb (65.8 kg)  08/01/17 145 lb (65.8 kg)  02/21/17 156 lb (70.8 kg)     PHYSICAL EXAM General: NAD, using oxygen by nasal cannula. HEENT: Normal. Neck: No JVD, no thyromegaly. Lungs: Clear to auscultation bilaterally with normal respiratory effort. CV: Regular rate and rhythm, normal S1/S2, no S3/S4, no murmur.  Trace bilateral lower extremity edema.    Abdomen: Soft, nontender, no distention.  Neurologic: Alert and oriented.  Psych: Normal affect. Skin:  Normal. Musculoskeletal: No gross deformities.    ECG: Reviewed above under Subjective   Labs: Lab Results  Component Value Date/Time   K 4.6 12/20/2013 01:00 PM   BUN 22 12/20/2013 01:00 PM  CREATININE 1.04 12/20/2013 01:00 PM   HGB 13.5 12/20/2013 01:00 PM     Lipids: No results found for: LDLCALC, LDLDIRECT, CHOL, TRIG, HDL     ASSESSMENT AND PLAN: 1.  Coronary artery disease with exertional dyspnea: Low risk nuclear stress test as noted above.  Continue aspirin and Imdur 60 mg daily.  I will reduce metoprolol to 25 mg twice daily as he has some fatigue.  2.  Hypertension: Controlled on present therapy.  I will monitor.    Disposition: Follow up 6 months   Prentice Docker, M.D., F.A.C.C.

## 2017-09-28 NOTE — Patient Instructions (Addendum)
Your physician wants you to follow-up in: 6 months with Dr.Koneswaran You will receive a reminder letter in the mail two months in advance. If you don't receive a letter, please call our office to schedule the follow-up appointment.      DECREASE Metoprolol to 25 mg twice a day       If you need a refill on your cardiac medications before your next appointment, please call your pharmacy.      No labs or tests today       Thank you for choosing [facility] !

## 2018-01-23 ENCOUNTER — Other Ambulatory Visit: Payer: Self-pay | Admitting: Cardiovascular Disease

## 2018-02-11 ENCOUNTER — Other Ambulatory Visit: Payer: Self-pay | Admitting: Cardiovascular Disease

## 2018-02-20 ENCOUNTER — Other Ambulatory Visit: Payer: Self-pay

## 2018-02-20 MED ORDER — METOPROLOL TARTRATE 25 MG PO TABS: 25.0000 mg | ORAL_TABLET | Freq: Two times a day (BID) | ORAL | 3 refills | Status: DC

## 2018-03-17 ENCOUNTER — Encounter: Payer: Self-pay | Admitting: *Deleted

## 2018-03-20 ENCOUNTER — Ambulatory Visit: Payer: Medicare Other | Admitting: Cardiovascular Disease

## 2018-03-20 ENCOUNTER — Encounter: Payer: Self-pay | Admitting: Cardiovascular Disease

## 2018-03-20 VITALS — BP 112/52 | HR 74 | Wt 139.0 lb

## 2018-03-20 DIAGNOSIS — I10 Essential (primary) hypertension: Secondary | ICD-10-CM | POA: Diagnosis not present

## 2018-03-20 DIAGNOSIS — I25118 Atherosclerotic heart disease of native coronary artery with other forms of angina pectoris: Secondary | ICD-10-CM | POA: Diagnosis not present

## 2018-03-20 DIAGNOSIS — Z955 Presence of coronary angioplasty implant and graft: Secondary | ICD-10-CM

## 2018-03-20 DIAGNOSIS — R0602 Shortness of breath: Secondary | ICD-10-CM | POA: Diagnosis not present

## 2018-03-20 NOTE — Progress Notes (Addendum)
SUBJECTIVE:  The patient presents for routine follow-up.  He underwent a low risk stress test on 08/09/2017.  There were T wave inversions in leads III and aVF seen throughout the study.  There were defects that appeared to be due to variable soft tissue attenuation although a mild degree of ischemia could not entirely be ruled out.  He underwent right coronary artery stent placement in 2004.  He is here with his son, Deniece PortelaWayne, who is a Market researcherUnited Methodist pastor in [place].  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  The patient denies chest pain.  He uses oxygen around-the-clock.  He has had some shortness of breath and fatigue and has been coughing up a lot of sputum in the mornings.  He sees his PCP once a month.  He told me he had a chest x-ray which was clear.  He told me that he has made 3 trips to [place] and has also been to [place].  ECG performed in the office today which I ordered and personally interpreted demonstrates normal sinus rhythm with right bundle branch block and left posterior fascicular block.  Review of Systems: As per "subjective", otherwise negative.  Allergies  Allergen Reactions  . Codeine   . Cortisone     Current Outpatient Medications  Medication Sig Dispense Refill  . albuterol (ACCUNEB) 1.25 MG/3ML nebulizer solution Take 1 ampule by nebulization 2 (two) times daily as needed for wheezing or shortness of breath.     Marland Kitchen. amLODipine (NORVASC) 10 MG tablet Take 1 tablet (10 mg total) by mouth daily. 90 tablet 3  . aspirin EC 81 MG tablet Take 81 mg by mouth daily.    . fluticasone (FLOVENT HFA) 220 MCG/ACT inhaler Inhale 1 puff into the lungs daily.     Marland Kitchen. HYDROcodone-acetaminophen (NORCO) 7.5-325 MG per tablet Take 1 tablet by mouth 4 (four) times daily.      . isosorbide mononitrate (IMDUR) 60 MG 24 hr tablet TAKE 1 TABLET BY MOUTH EVERY DAY 90 tablet 2  . metoprolol tartrate (LOPRESSOR) 25 MG tablet Take 1  tablet (25 mg total) by mouth 2 (two) times daily. 180 tablet 3  . Multiple Vitamin (MULTIVITAMIN) tablet Take 1 tablet by mouth daily.      . Multiple Vitamins-Minerals (OCUVITE PO) Take 1 tablet by mouth daily.    . nitroGLYCERIN (NITROSTAT) 0.4 MG SL tablet Place 1 tablet (0.4 mg total) under the tongue every 5 (five) minutes x 3 doses as needed. 25 tablet 3  . NYSTATIN PO Take by mouth.    . OMEGA-3 KRILL OIL 300 MG CAPS Take 1 capsule by mouth daily. MEGA RED    . OXYGEN-HELIUM IN Inhale 2 L into the lungs as directed.    . polyethylene glycol powder (MIRALAX) powder Take 17 g by mouth daily. UAD     . pravastatin (PRAVACHOL) 10 MG tablet Take 10 mg daily by mouth.    . pregabalin (LYRICA) 75 MG capsule Take 75 mg daily by mouth.    . Tamsulosin HCl (FLOMAX) 0.4 MG CAPS Take 0.4 mg by mouth daily.      . vitamin B-12 (CYANOCOBALAMIN) 1000 MCG tablet Take 1,000 mcg by mouth daily.       No current facility-administered medications for this visit.     Past Medical History:  Diagnosis Date  . Arthritis   . Chest pain, unspecified   . Chronic low  back pain   . Coronary atherosclerosis of native coronary artery    Status post stent RCA, 2004  . Ejection fraction    EF 65%, echo, July, 2013  . GERD (gastroesophageal reflux disease)   . HTN (hypertension)   . Other and unspecified hyperlipidemia    mixed  . Peripheral neuropathy   . Pulmonary hypertension (HCC)    Echo, July, 2013, right ventricular systolic pressure estimate 43 mmHg.    Past Surgical History:  Procedure Laterality Date  . CATARACT EXTRACTION W/ INTRAOCULAR LENS IMPLANT Right   . CATARACT EXTRACTION W/PHACO Left 12/25/2013   Procedure: CATARACT EXTRACTION PHACO AND INTRAOCULAR LENS PLACEMENT (IOC);  Surgeon: Loraine Leriche T. Nile Riggs, MD;  Location: [facility];  Service: Ophthalmology;  Laterality: Left;  CDE:9.07  . CHOLECYSTECTOMY    . CORONARY ANGIOPLASTY WITH STENT PLACEMENT  2004    Social History   Socioeconomic  History  . Marital status: Married    Spouse name: Not on file  . Number of children: Not on file  . Years of education: Not on file  . Highest education level: Not on file  Occupational History  . Not on file  Social Needs  . Financial resource strain: Not on file  . Food insecurity:    Worry: Not on file    Inability: Not on file  . Transportation needs:    Medical: Not on file    Non-medical: Not on file  Tobacco Use  . Smoking status: Former Smoker    Packs/day: 1.00    Years: 30.00    Pack years: 30.00    Types: Cigarettes    Last attempt to quit: 03/01/1958    Years since quitting: 60.0  . Smokeless tobacco: Never Used  Substance and Sexual Activity  . Alcohol use: No    Alcohol/week: 0.0 standard drinks  . Drug use: No  . Sexual activity: Never  Lifestyle  . Physical activity:    Days per week: Not on file    Minutes per session: Not on file  . Stress: Not on file  Relationships  . Social connections:    Talks on phone: Not on file    Gets together: Not on file    Attends religious service: Not on file    Active member of club or organization: Not on file    Attends meetings of clubs or organizations: Not on file    Relationship status: Not on file  . Intimate partner violence:    Fear of current or ex partner: Not on file    Emotionally abused: Not on file    Physically abused: Not on file    Forced sexual activity: Not on file  Other Topics Concern  . Not on file  Social History Narrative   Retired, married.     Vitals:   03/20/18 1437  BP: (!) 112/52  Pulse: 74  Weight: 139 lb (63 kg)    Wt Readings from Last 3 Encounters:  03/20/18 139 lb (63 kg)  09/28/17 145 lb (65.8 kg)  08/01/17 145 lb (65.8 kg)     PHYSICAL EXAM General: NAD, using oxygen by nasal cannula. HEENT: Normal. Neck: No JVD, no thyromegaly. Lungs: Poor air movement, no wheezes or crackles. CV: Regular rate and rhythm, normal S1/S2, no S3/S4, no murmur. No pretibial or  periankle edema.  Abdomen: Soft, nontender, no distention.  Neurologic: Alert and oriented.  Psych: Normal affect. Skin: Normal. Musculoskeletal: No gross deformities.    ECG: Reviewed above  under Subjective   Labs: Lab Results  Component Value Date/Time   K 4.6 12/20/2013 01:00 PM   BUN 22 12/20/2013 01:00 PM   CREATININE 1.04 12/20/2013 01:00 PM   HGB 13.5 12/20/2013 01:00 PM     Lipids: No results found for: LDLCALC, LDLDIRECT, CHOL, TRIG, HDL     ASSESSMENT AND PLAN: 1.  Coronary artery disease with exertional dyspnea: Low risk nuclear stress test as noted above.  Continue aspirin, Lopressor, and Imdur 60 mg daily.    2.  Hypertension: Controlled on present therapy.    No changes to therapy.  3.  Shortness of breath: I recommend that he use his nebulizer and try some Mucinex.   Disposition: Follow up 6 months   Prentice Docker, M.D., F.A.C.C.

## 2018-03-20 NOTE — Patient Instructions (Signed)

## 2018-09-14 ENCOUNTER — Inpatient Hospital Stay (HOSPITAL_COMMUNITY)
Admission: EM | Admit: 2018-09-14 | Discharge: 2018-09-19 | DRG: 193 | Disposition: A | Discharge status: Home / Self Care | Payer: Medicare Other | Attending: Internal Medicine | Admitting: Internal Medicine

## 2018-09-14 ENCOUNTER — Encounter (HOSPITAL_COMMUNITY): Payer: Self-pay | Admitting: Student

## 2018-09-14 ENCOUNTER — Emergency Department (HOSPITAL_COMMUNITY): Payer: Medicare Other

## 2018-09-14 ENCOUNTER — Other Ambulatory Visit: Payer: Self-pay

## 2018-09-14 DIAGNOSIS — Z9981 Dependence on supplemental oxygen: Secondary | ICD-10-CM

## 2018-09-14 DIAGNOSIS — E785 Hyperlipidemia, unspecified: Secondary | ICD-10-CM | POA: Diagnosis present

## 2018-09-14 DIAGNOSIS — Y92410 Unspecified street and highway as the place of occurrence of the external cause: Secondary | ICD-10-CM

## 2018-09-14 DIAGNOSIS — R0902 Hypoxemia: Secondary | ICD-10-CM

## 2018-09-14 DIAGNOSIS — Z8249 Family history of ischemic heart disease and other diseases of the circulatory system: Secondary | ICD-10-CM

## 2018-09-14 DIAGNOSIS — I272 Pulmonary hypertension, unspecified: Secondary | ICD-10-CM | POA: Diagnosis present

## 2018-09-14 DIAGNOSIS — Z8709 Personal history of other diseases of the respiratory system: Secondary | ICD-10-CM

## 2018-09-14 DIAGNOSIS — J441 Chronic obstructive pulmonary disease with (acute) exacerbation: Secondary | ICD-10-CM | POA: Diagnosis present

## 2018-09-14 DIAGNOSIS — S20219A Contusion of unspecified front wall of thorax, initial encounter: Secondary | ICD-10-CM

## 2018-09-14 DIAGNOSIS — J189 Pneumonia, unspecified organism: Principal | ICD-10-CM | POA: Diagnosis present

## 2018-09-14 DIAGNOSIS — G8929 Other chronic pain: Secondary | ICD-10-CM | POA: Diagnosis present

## 2018-09-14 DIAGNOSIS — M25561 Pain in right knee: Secondary | ICD-10-CM | POA: Diagnosis present

## 2018-09-14 DIAGNOSIS — J9621 Acute and chronic respiratory failure with hypoxia: Secondary | ICD-10-CM

## 2018-09-14 DIAGNOSIS — M545 Low back pain, unspecified: Secondary | ICD-10-CM | POA: Diagnosis present

## 2018-09-14 DIAGNOSIS — S2239XA Fracture of one rib, unspecified side, initial encounter for closed fracture: Secondary | ICD-10-CM | POA: Diagnosis present

## 2018-09-14 DIAGNOSIS — R079 Chest pain, unspecified: Secondary | ICD-10-CM | POA: Diagnosis present

## 2018-09-14 DIAGNOSIS — S2249XA Multiple fractures of ribs, unspecified side, initial encounter for closed fracture: Secondary | ICD-10-CM | POA: Diagnosis present

## 2018-09-14 DIAGNOSIS — Z20828 Contact with and (suspected) exposure to other viral communicable diseases: Secondary | ICD-10-CM | POA: Diagnosis present

## 2018-09-14 DIAGNOSIS — J440 Chronic obstructive pulmonary disease with acute lower respiratory infection: Secondary | ICD-10-CM | POA: Diagnosis present

## 2018-09-14 DIAGNOSIS — Z955 Presence of coronary angioplasty implant and graft: Secondary | ICD-10-CM

## 2018-09-14 DIAGNOSIS — I2510 Atherosclerotic heart disease of native coronary artery without angina pectoris: Secondary | ICD-10-CM | POA: Diagnosis present

## 2018-09-14 DIAGNOSIS — E782 Mixed hyperlipidemia: Secondary | ICD-10-CM | POA: Diagnosis present

## 2018-09-14 DIAGNOSIS — Z66 Do not resuscitate: Secondary | ICD-10-CM | POA: Diagnosis present

## 2018-09-14 DIAGNOSIS — I10 Essential (primary) hypertension: Secondary | ICD-10-CM | POA: Diagnosis present

## 2018-09-14 DIAGNOSIS — M79672 Pain in left foot: Secondary | ICD-10-CM | POA: Diagnosis present

## 2018-09-14 DIAGNOSIS — I4892 Unspecified atrial flutter: Secondary | ICD-10-CM | POA: Diagnosis present

## 2018-09-14 DIAGNOSIS — Z7982 Long term (current) use of aspirin: Secondary | ICD-10-CM

## 2018-09-14 DIAGNOSIS — J8410 Pulmonary fibrosis, unspecified: Secondary | ICD-10-CM | POA: Diagnosis present

## 2018-09-14 DIAGNOSIS — I471 Supraventricular tachycardia: Secondary | ICD-10-CM | POA: Diagnosis present

## 2018-09-14 LAB — COMPREHENSIVE METABOLIC PANEL
ALT: 17 U/L (ref 0–44)
AST: 30 U/L (ref 15–41)
Albumin: 3.4 g/dL — ABNORMAL LOW (ref 3.5–5.0)
Alkaline Phosphatase: 73 U/L (ref 38–126)
Anion gap: 7 (ref 5–15)
BUN: 33 mg/dL — ABNORMAL HIGH (ref 8–23)
CO2: 31 mmol/L (ref 22–32)
Calcium: 8.5 mg/dL — ABNORMAL LOW (ref 8.9–10.3)
Chloride: 98 mmol/L (ref 98–111)
Creatinine, Ser: 1.12 mg/dL (ref 0.61–1.24)
GFR calc Af Amer: >60 mL/min (ref >60)
GFR calc non Af Amer: 56 mL/min — ABNORMAL LOW (ref >60)
Glucose, Bld: 109 mg/dL — ABNORMAL HIGH (ref 70–99)
Potassium: 5.2 mmol/L — ABNORMAL HIGH (ref 3.5–5.1)
Sodium: 136 mmol/L (ref 135–145)
Total Bilirubin: 0.6 mg/dL (ref 0.3–1.2)
Total Protein: 7.6 g/dL (ref 6.5–8.1)

## 2018-09-14 LAB — BLOOD GAS, ARTERIAL
Acid-Base Excess: 5.3 mmol/L — ABNORMAL HIGH (ref 0.0–2.0)
Bicarbonate: 26.8 mmol/L (ref 20.0–28.0)
Drawn by: 38235
FIO2: 100
O2 Saturation: 93.7 %
Patient temperature: 37
pCO2 arterial: 74.8 mmHg (ref 32.0–48.0)
pH, Arterial: 7.255 — ABNORMAL LOW (ref 7.350–7.450)
pO2, Arterial: 82 mmHg — ABNORMAL LOW (ref 83.0–108.0)

## 2018-09-14 LAB — CBC
HCT: 42.3 % (ref 39.0–52.0)
Hemoglobin: 13.5 g/dL (ref 13.0–17.0)
MCH: 32.8 pg (ref 26.0–34.0)
MCHC: 31.9 g/dL (ref 30.0–36.0)
MCV: 102.7 fL — ABNORMAL HIGH (ref 80.0–100.0)
Platelets: 160 10*3/uL (ref 150–400)
RBC: 4.12 MIL/uL — ABNORMAL LOW (ref 4.22–5.81)
RDW: 12.7 % (ref 11.5–15.5)
WBC: 5.3 10*3/uL (ref 4.0–10.5)
nRBC: 0 % (ref 0.0–0.2)

## 2018-09-14 LAB — SAMPLE TO BLOOD BANK

## 2018-09-14 LAB — CBG MONITORING, ED: Glucose-Capillary: 147 mg/dL — ABNORMAL HIGH (ref 70–99)

## 2018-09-14 MED ORDER — SODIUM CHLORIDE 0.9 % IV SOLN
3.0000 g | Freq: Three times a day (TID) | INTRAVENOUS | Status: DC
  Administered 2018-09-15 – 2018-09-19 (×15): 3 g via INTRAVENOUS
  Filled 2018-09-14 (×2): qty 8
  Filled 2018-09-14 (×2): qty 3
  Filled 2018-09-14 (×2): qty 8
  Filled 2018-09-14 (×2): qty 3
  Filled 2018-09-14: qty 8
  Filled 2018-09-14: qty 3
  Filled 2018-09-14 (×5): qty 8
  Filled 2018-09-14: qty 3
  Filled 2018-09-14: qty 8

## 2018-09-14 MED ORDER — IOHEXOL 300 MG/ML  SOLN
100.0000 mL | Freq: Once | INTRAMUSCULAR | Status: AC | PRN
  Administered 2018-09-14: 100 mL via INTRAVENOUS

## 2018-09-14 MED ORDER — FENTANYL CITRATE (PF) 100 MCG/2ML IJ SOLN
12.5000 ug | Freq: Once | INTRAMUSCULAR | Status: AC
  Administered 2018-09-14: 12.5 ug via INTRAVENOUS
  Filled 2018-09-14: qty 2

## 2018-09-14 MED ORDER — SODIUM CHLORIDE 0.9 % IV BOLUS
500.0000 mL | Freq: Once | INTRAVENOUS | Status: AC
  Administered 2018-09-14: 500 mL via INTRAVENOUS
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
## 2018-09-14 NOTE — Discharge Instructions (Addendum)
You are signing out [place].  You are having episodes of hypoxia, meaning that your oxygen levels are dropping.  This is a potentially life-threatening situation and I have strongly advised that you stay in the hospital.  You are welcome to return to the emergency department at anytime should your breathing worsen or you develop any other new and/or concerning symptoms.   Information on my medicine - ELIQUIS (apixaban)  This medication education was reviewed with me or my healthcare representative as part of my discharge preparation.  Why was Eliquis prescribed for you? Eliquis was prescribed for you to reduce the risk of a blood clot forming that can cause a stroke if you have a medical condition called atrial fibrillation (a type of irregular heartbeat).  What do You need to know about Eliquis ? Take your Eliquis TWICE DAILY - one tablet in the morning and one tablet in the evening with or without food. If you have difficulty swallowing the tablet whole please discuss with your pharmacist how to take the medication safely.  Take Eliquis exactly as prescribed by your doctor and DO NOT stop taking Eliquis without talking to the doctor who prescribed the medication.  Stopping may increase your risk of developing a stroke.  Refill your prescription before you run out.  After discharge, you should have regular check-up appointments with your healthcare provider that is prescribing your Eliquis.  In the future your dose may need to be changed if your kidney function or weight changes by a significant amount or as you get older.  What do you do if you miss a dose? If you miss a dose, take it as soon as you remember on the same day and resume taking twice daily.  Do not take more than one dose of ELIQUIS at the same time to make up a missed dose.  Important Safety Information A possible side effect of Eliquis is bleeding. You should call your healthcare provider right away if  you experience any of the following: ? Bleeding from an injury or your nose that does not stop. ? Unusual colored urine (red or dark brown) or unusual colored stools (red or black). ? Unusual bruising for unknown reasons. ? A serious fall or if you hit your head (even if there is no bleeding).  Some medicines may interact with Eliquis and might increase your risk of bleeding or clotting while on Eliquis. To help avoid this, consult your healthcare provider or pharmacist prior to using any new prescription or non-prescription medications, including herbals, vitamins, non-steroidal anti-inflammatory drugs (NSAIDs) and supplements.  This website has more information on Eliquis (apixaban): http://www.eliquis.com/eliquis/home

## 2018-09-14 NOTE — ED Notes (Signed)
Patient dropped to 63% on 3L [place]  Non-re breather mask placed on patient  Oxygen increased to 92%  EDP at bedside Pt complained of having to cough and that there felt like "something in my chest" Pt has non-productive cough
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
## 2018-09-14 NOTE — ED Notes (Signed)
[place]

## 2018-09-14 NOTE — ED Notes (Signed)
Pt calling son to tell him to go home until he is ready to go.

## 2018-09-15 ENCOUNTER — Encounter (HOSPITAL_COMMUNITY): Payer: Self-pay

## 2018-09-15 DIAGNOSIS — S2249XA Multiple fractures of ribs, unspecified side, initial encounter for closed fracture: Secondary | ICD-10-CM | POA: Diagnosis present

## 2018-09-15 DIAGNOSIS — J440 Chronic obstructive pulmonary disease with acute lower respiratory infection: Secondary | ICD-10-CM | POA: Diagnosis present

## 2018-09-15 DIAGNOSIS — M25561 Pain in right knee: Secondary | ICD-10-CM | POA: Diagnosis present

## 2018-09-15 DIAGNOSIS — I471 Supraventricular tachycardia: Secondary | ICD-10-CM | POA: Diagnosis present

## 2018-09-15 DIAGNOSIS — I10 Essential (primary) hypertension: Secondary | ICD-10-CM

## 2018-09-15 DIAGNOSIS — Z66 Do not resuscitate: Secondary | ICD-10-CM | POA: Diagnosis present

## 2018-09-15 DIAGNOSIS — J189 Pneumonia, unspecified organism: Secondary | ICD-10-CM | POA: Diagnosis present

## 2018-09-15 DIAGNOSIS — Z20828 Contact with and (suspected) exposure to other viral communicable diseases: Secondary | ICD-10-CM | POA: Diagnosis present

## 2018-09-15 DIAGNOSIS — Z955 Presence of coronary angioplasty implant and graft: Secondary | ICD-10-CM | POA: Diagnosis not present

## 2018-09-15 DIAGNOSIS — J441 Chronic obstructive pulmonary disease with (acute) exacerbation: Secondary | ICD-10-CM

## 2018-09-15 DIAGNOSIS — S2231XD Fracture of one rib, right side, subsequent encounter for fracture with routine healing: Secondary | ICD-10-CM | POA: Diagnosis not present

## 2018-09-15 DIAGNOSIS — Y92410 Unspecified street and highway as the place of occurrence of the external cause: Secondary | ICD-10-CM | POA: Diagnosis not present

## 2018-09-15 DIAGNOSIS — Z9981 Dependence on supplemental oxygen: Secondary | ICD-10-CM | POA: Diagnosis not present

## 2018-09-15 DIAGNOSIS — M79672 Pain in left foot: Secondary | ICD-10-CM | POA: Diagnosis present

## 2018-09-15 DIAGNOSIS — J8410 Pulmonary fibrosis, unspecified: Secondary | ICD-10-CM | POA: Diagnosis present

## 2018-09-15 DIAGNOSIS — S20219A Contusion of unspecified front wall of thorax, initial encounter: Secondary | ICD-10-CM | POA: Diagnosis not present

## 2018-09-15 DIAGNOSIS — Z7982 Long term (current) use of aspirin: Secondary | ICD-10-CM | POA: Diagnosis not present

## 2018-09-15 DIAGNOSIS — J9621 Acute and chronic respiratory failure with hypoxia: Secondary | ICD-10-CM

## 2018-09-15 DIAGNOSIS — S2239XA Fracture of one rib, unspecified side, initial encounter for closed fracture: Secondary | ICD-10-CM | POA: Diagnosis present

## 2018-09-15 DIAGNOSIS — G8929 Other chronic pain: Secondary | ICD-10-CM | POA: Diagnosis present

## 2018-09-15 DIAGNOSIS — I4892 Unspecified atrial flutter: Secondary | ICD-10-CM | POA: Diagnosis present

## 2018-09-15 DIAGNOSIS — J449 Chronic obstructive pulmonary disease, unspecified: Secondary | ICD-10-CM | POA: Diagnosis not present

## 2018-09-15 DIAGNOSIS — I2510 Atherosclerotic heart disease of native coronary artery without angina pectoris: Secondary | ICD-10-CM | POA: Diagnosis present

## 2018-09-15 DIAGNOSIS — E782 Mixed hyperlipidemia: Secondary | ICD-10-CM | POA: Diagnosis present

## 2018-09-15 DIAGNOSIS — I340 Nonrheumatic mitral (valve) insufficiency: Secondary | ICD-10-CM | POA: Diagnosis not present

## 2018-09-15 DIAGNOSIS — R079 Chest pain, unspecified: Secondary | ICD-10-CM | POA: Diagnosis present

## 2018-09-15 DIAGNOSIS — I272 Pulmonary hypertension, unspecified: Secondary | ICD-10-CM | POA: Diagnosis present

## 2018-09-15 DIAGNOSIS — I361 Nonrheumatic tricuspid (valve) insufficiency: Secondary | ICD-10-CM | POA: Diagnosis not present

## 2018-09-15 DIAGNOSIS — R0902 Hypoxemia: Secondary | ICD-10-CM | POA: Diagnosis present

## 2018-09-15 DIAGNOSIS — Z8249 Family history of ischemic heart disease and other diseases of the circulatory system: Secondary | ICD-10-CM | POA: Diagnosis not present

## 2018-09-15 LAB — RESPIRATORY PANEL BY PCR

## 2018-09-15 LAB — SARS CORONAVIRUS 2 BY RT PCR (HOSPITAL ORDER, PERFORMED IN ~~LOC~~ HOSPITAL LAB): SARS Coronavirus 2: NEGATIVE

## 2018-09-15 LAB — GLUCOSE, CAPILLARY: Glucose-Capillary: 218 mg/dL — ABNORMAL HIGH (ref 70–99)

## 2018-09-15 LAB — MRSA PCR SCREENING: MRSA by PCR: POSITIVE — AB

## 2018-09-15 LAB — PROCALCITONIN: Procalcitonin: <0.1 ng/mL

## 2018-09-15 MED ORDER — HYDROCODONE-ACETAMINOPHEN 7.5-325 MG PO TABS
1.0000 | ORAL_TABLET | Freq: Four times a day (QID) | ORAL | Status: DC
  Administered 2018-09-15 – 2018-09-19 (×17): 1 via ORAL
  Filled 2018-09-15 (×17): qty 1

## 2018-09-15 MED ORDER — LEVALBUTEROL HCL 1.25 MG/0.5ML IN NEBU
1.2500 mg | INHALATION_SOLUTION | Freq: Four times a day (QID) | RESPIRATORY_TRACT | Status: DC
  Administered 2018-09-15 – 2018-09-16 (×4): 1.25 mg via RESPIRATORY_TRACT
  Filled 2018-09-15 (×4): qty 0.5

## 2018-09-15 MED ORDER — LEVALBUTEROL HCL 1.25 MG/0.5ML IN NEBU
1.2500 mg | INHALATION_SOLUTION | Freq: Four times a day (QID) | RESPIRATORY_TRACT | Status: DC
  Administered 2018-09-15: 1.25 mg via RESPIRATORY_TRACT
  Filled 2018-09-15: qty 0.5

## 2018-09-15 MED ORDER — IPRATROPIUM BROMIDE 0.02 % IN SOLN
0.5000 mg | Freq: Four times a day (QID) | RESPIRATORY_TRACT | Status: DC
  Administered 2018-09-15 – 2018-09-16 (×4): 0.5 mg via RESPIRATORY_TRACT
  Filled 2018-09-15 (×3): qty 2.5

## 2018-09-15 MED ORDER — BUDESONIDE 0.5 MG/2ML IN SUSP
2.0000 mL | Freq: Two times a day (BID) | RESPIRATORY_TRACT | Status: DC
  Administered 2018-09-15 – 2018-09-19 (×9): 0.5 mg via RESPIRATORY_TRACT
  Filled 2018-09-15 (×10): qty 2

## 2018-09-15 MED ORDER — AZITHROMYCIN 250 MG PO TABS
500.0000 mg | ORAL_TABLET | Freq: Every day | ORAL | Status: DC
  Administered 2018-09-15 – 2018-09-19 (×5): 500 mg via ORAL
  Filled 2018-09-15 (×5): qty 2

## 2018-09-15 MED ORDER — PREGABALIN 75 MG PO CAPS
75.0000 mg | ORAL_CAPSULE | Freq: Every day | ORAL | Status: DC
  Administered 2018-09-15 – 2018-09-19 (×5): 75 mg via ORAL
  Filled 2018-09-15 (×5): qty 1

## 2018-09-15 MED ORDER — ALBUTEROL SULFATE (2.5 MG/3ML) 0.083% IN NEBU
3.0000 mL | INHALATION_SOLUTION | Freq: Two times a day (BID) | RESPIRATORY_TRACT | Status: DC | PRN
  Administered 2018-09-17: 3 mL via RESPIRATORY_TRACT
  Filled 2018-09-15: qty 3

## 2018-09-15 MED ORDER — CHLORHEXIDINE GLUCONATE CLOTH 2 % EX PADS
6.0000 | MEDICATED_PAD | Freq: Every day | CUTANEOUS | Status: DC
  Administered 2018-09-16: 6 via TOPICAL

## 2018-09-15 MED ORDER — MUPIROCIN 2 % EX OINT
1.0000 "application " | TOPICAL_OINTMENT | Freq: Two times a day (BID) | CUTANEOUS | Status: DC
  Administered 2018-09-15 – 2018-09-19 (×9): 1 via NASAL
  Filled 2018-09-15 (×5): qty 22

## 2018-09-15 MED ORDER — BUDESONIDE 0.5 MG/2ML IN SUSP
2.0000 mL | Freq: Two times a day (BID) | RESPIRATORY_TRACT | Status: DC
  Administered 2018-09-15: 0.5 mg via RESPIRATORY_TRACT

## 2018-09-15 MED ORDER — LORAZEPAM 2 MG/ML IJ SOLN
0.5000 mg | Freq: Once | INTRAMUSCULAR | Status: AC
  Administered 2018-09-15: 0.5 mg via INTRAVENOUS
  Filled 2018-09-15: qty 1

## 2018-09-15 MED ORDER — METHYLPREDNISOLONE SODIUM SUCC 40 MG IJ SOLR
40.0000 mg | Freq: Once | INTRAMUSCULAR | Status: AC
  Administered 2018-09-15: 40 mg via INTRAVENOUS
  Filled 2018-09-15: qty 1

## 2018-09-15 MED ORDER — POLYETHYLENE GLYCOL 3350 17 G PO PACK
17.0000 g | PACK | Freq: Every day | ORAL | Status: DC
  Administered 2018-09-15 – 2018-09-19 (×5): 17 g via ORAL
  Filled 2018-09-15 (×5): qty 1

## 2018-09-15 MED ORDER — PRAVASTATIN SODIUM 10 MG PO TABS
10.0000 mg | ORAL_TABLET | Freq: Every day | ORAL | Status: DC
  Administered 2018-09-15 – 2018-09-19 (×5): 10 mg via ORAL
  Filled 2018-09-15 (×5): qty 1

## 2018-09-15 MED ORDER — AMLODIPINE BESYLATE 5 MG PO TABS: 10.0000 mg | ORAL_TABLET | Freq: Every day | ORAL | Status: DC

## 2018-09-15 MED ORDER — KETOROLAC TROMETHAMINE 15 MG/ML IJ SOLN
15.0000 mg | Freq: Once | INTRAMUSCULAR | Status: AC
  Administered 2018-09-15: 15 mg via INTRAVENOUS
  Filled 2018-09-15: qty 1

## 2018-09-15 MED ORDER — ISOSORBIDE MONONITRATE ER 60 MG PO TB24
60.0000 mg | ORAL_TABLET | Freq: Every day | ORAL | Status: DC
  Administered 2018-09-15 – 2018-09-19 (×5): 60 mg via ORAL
  Filled 2018-09-15 (×5): qty 1

## 2018-09-15 MED ORDER — NITROGLYCERIN 0.4 MG SL SUBL: 0.4000 mg | SUBLINGUAL_TABLET | SUBLINGUAL | Status: DC | PRN

## 2018-09-15 MED ORDER — POLYETHYLENE GLYCOL 3350 17 GM/SCOOP PO POWD
17.0000 g | Freq: Every day | ORAL | Status: DC
  Filled 2018-09-15: qty 255

## 2018-09-15 MED ORDER — METHYLPREDNISOLONE SODIUM SUCC 125 MG IJ SOLR
60.0000 mg | Freq: Two times a day (BID) | INTRAMUSCULAR | Status: DC
  Administered 2018-09-15 – 2018-09-19 (×9): 60 mg via INTRAVENOUS
  Filled 2018-09-15 (×11): qty 2

## 2018-09-15 MED ORDER — TAMSULOSIN HCL 0.4 MG PO CAPS
0.4000 mg | ORAL_CAPSULE | Freq: Every day | ORAL | Status: DC
  Administered 2018-09-15 – 2018-09-19 (×5): 0.4 mg via ORAL
  Filled 2018-09-15 (×5): qty 1

## 2018-09-15 MED ORDER — ASPIRIN EC 81 MG PO TBEC
81.0000 mg | DELAYED_RELEASE_TABLET | Freq: Every day | ORAL | Status: DC
  Administered 2018-09-15 – 2018-09-18 (×4): 81 mg via ORAL
  Filled 2018-09-15 (×5): qty 1

## 2018-09-15 NOTE — Progress Notes (Signed)
Pt stated it was ok to give his Grandson information.

## 2018-09-15 NOTE — Progress Notes (Signed)
RN in room to find patient slid all the way down in the bed for the second time. RN and NT pulled patient back up- patient very anxious with oxygen sats 70's. Pts oxygen increased to 15L HFNC. Pt asked if he could have something to calm him down. Dr. Silas [place] paged to see if something could be ordered. Waiting on call back/orders. Will continue to monitor pt

## 2018-09-15 NOTE — ED Notes (Signed)
CRITICAL VALUE ALERT  Critical Value: PCO2 74.8  Date & Time Notied:  09/15/2018 2352  Provider Notified: EDP [place]   Orders Received/Actions taken: No orders at this time

## 2018-09-15 NOTE — Care Management Important Message (Signed)
Important Message  Patient Details  Name: Wayne [name] MRN: [id number] Date of Birth: Oct 17, 1921   Medicare Important Message Given:  Yes     Tommy Medal 09/15/2018, 4:23 PM

## 2018-09-15 NOTE — ED Notes (Signed)
Pt wanting to leave facility AMA This nurse helped patient get unhooked from cardiac monitor. Pt wanted to button and zip up his clothes on his own. Pt started to speak and not make sense. This nurse told patient he really needed to stay. Pt states "I am going home, I'm not staying" Pt tried to stand and was unsteady. This nurse grabbed patient's belt on waste and yelled for help. This nurse lowered patient to floor because I could not get him in the chair. Nurses came in to help nurse and we put him in the wheelchair. Pt was unresponsive and his dentures fell out of his mouth, he also had purple lips. Pt was wearing his oxygen [place] on 3 L. Pt was put into bed by 4 nurses and placed on non rebreather at 15 L and cardiac monitor was placed on patient. Pt was able to respond and was pink in color after 3 minutes. Pt had skin tear to left lower inner calf from transfer from floor to wheelchair. Wound has been wrapped and is no longer bleeding. Pt does not remember what happened and still wants to go home. Pt's son came in and talked patient into staying. Pt states he will stay, but that when its his time to go hes ready.
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
## 2018-09-15 NOTE — ED Provider Notes (Signed)
EKG Interpretation  Date/Time:  Friday September 15 2018 00:09:24 EDT Ventricular Rate:  102 PR Interval:    QRS Duration: 126 QT Interval:  367 QTC Calculation: 479 R Axis:   145 Text Interpretation:  Sinus tachycardia Paired ventricular premature complexes RBBB and LPFB Confirmed by Ripley Fraise [phone]) on 09/15/2018 12:27:22 AM      Patient has improved, resting comfortably.  He has been seen by the hospitalist for admission.  He has agreed to be admitted.   Ripley Fraise, MD 09/15/18 Shelah Lewandowsky

## 2018-09-16 DIAGNOSIS — S2231XD Fracture of one rib, right side, subsequent encounter for fracture with routine healing: Secondary | ICD-10-CM

## 2018-09-16 LAB — CBC
HCT: 38.3 % — ABNORMAL LOW (ref 39.0–52.0)
Hemoglobin: 12.4 g/dL — ABNORMAL LOW (ref 13.0–17.0)
MCH: 33.5 pg (ref 26.0–34.0)
MCHC: 32.4 g/dL (ref 30.0–36.0)
MCV: 103.5 fL — ABNORMAL HIGH (ref 80.0–100.0)
Platelets: 164 10*3/uL (ref 150–400)
RBC: 3.7 MIL/uL — ABNORMAL LOW (ref 4.22–5.81)
RDW: 12.6 % (ref 11.5–15.5)
WBC: 7.5 10*3/uL (ref 4.0–10.5)
nRBC: 0 % (ref 0.0–0.2)

## 2018-09-16 LAB — BASIC METABOLIC PANEL
Anion gap: 10 (ref 5–15)
BUN: 33 mg/dL — ABNORMAL HIGH (ref 8–23)
CO2: 31 mmol/L (ref 22–32)
Calcium: 8.6 mg/dL — ABNORMAL LOW (ref 8.9–10.3)
Chloride: 97 mmol/L — ABNORMAL LOW (ref 98–111)
Creatinine, Ser: 0.99 mg/dL (ref 0.61–1.24)
GFR calc Af Amer: >60 mL/min (ref >60)
GFR calc non Af Amer: >60 mL/min (ref >60)
Glucose, Bld: 149 mg/dL — ABNORMAL HIGH (ref 70–99)
Potassium: 5 mmol/L (ref 3.5–5.1)
Sodium: 138 mmol/L (ref 135–145)

## 2018-09-16 MED ORDER — LEVALBUTEROL HCL 1.25 MG/0.5ML IN NEBU: 1.2500 mg | INHALATION_SOLUTION | Freq: Four times a day (QID) | RESPIRATORY_TRACT | Status: DC | PRN

## 2018-09-16 MED ORDER — ACETAMINOPHEN 325 MG PO TABS: 650.0000 mg | ORAL_TABLET | Freq: Four times a day (QID) | ORAL | Status: DC | PRN

## 2018-09-16 MED ORDER — ORAL CARE MOUTH RINSE
15.0000 mL | Freq: Two times a day (BID) | OROMUCOSAL | Status: DC
  Administered 2018-09-16 – 2018-09-19 (×6): 15 mL via OROMUCOSAL

## 2018-09-16 MED ORDER — ONDANSETRON HCL 4 MG/2ML IJ SOLN: 4.0000 mg | Freq: Four times a day (QID) | INTRAMUSCULAR | Status: DC | PRN

## 2018-09-16 MED ORDER — LEVALBUTEROL HCL 1.25 MG/0.5ML IN NEBU
1.2500 mg | INHALATION_SOLUTION | Freq: Three times a day (TID) | RESPIRATORY_TRACT | Status: DC
  Administered 2018-09-16 – 2018-09-19 (×12): 1.25 mg via RESPIRATORY_TRACT
  Filled 2018-09-16 (×12): qty 0.5

## 2018-09-16 MED ORDER — IPRATROPIUM BROMIDE 0.02 % IN SOLN
0.5000 mg | Freq: Three times a day (TID) | RESPIRATORY_TRACT | Status: DC
  Administered 2018-09-16 – 2018-09-19 (×12): 0.5 mg via RESPIRATORY_TRACT
  Filled 2018-09-16 (×12): qty 2.5

## 2018-09-16 NOTE — Progress Notes (Signed)
Dressing on LLE changed with 2.2 gauze wrapped in kerlix.
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
## 2018-09-17 ENCOUNTER — Inpatient Hospital Stay (HOSPITAL_COMMUNITY): Payer: Medicare Other

## 2018-09-17 DIAGNOSIS — I471 Supraventricular tachycardia, unspecified: Secondary | ICD-10-CM

## 2018-09-17 DIAGNOSIS — I340 Nonrheumatic mitral (valve) insufficiency: Secondary | ICD-10-CM

## 2018-09-17 DIAGNOSIS — I361 Nonrheumatic tricuspid (valve) insufficiency: Secondary | ICD-10-CM

## 2018-09-17 LAB — ECHOCARDIOGRAM COMPLETE
Height: 70 in
Weight: 2296.31 oz

## 2018-09-17 LAB — BASIC METABOLIC PANEL
Anion gap: 8 (ref 5–15)
BUN: 36 mg/dL — ABNORMAL HIGH (ref 8–23)
CO2: 32 mmol/L (ref 22–32)
Calcium: 8.8 mg/dL — ABNORMAL LOW (ref 8.9–10.3)
Chloride: 98 mmol/L (ref 98–111)
Creatinine, Ser: 0.86 mg/dL (ref 0.61–1.24)
GFR calc Af Amer: >60 mL/min (ref >60)
GFR calc non Af Amer: >60 mL/min (ref >60)
Glucose, Bld: 135 mg/dL — ABNORMAL HIGH (ref 70–99)
Potassium: 4.6 mmol/L (ref 3.5–5.1)
Sodium: 138 mmol/L (ref 135–145)

## 2018-09-17 LAB — MAGNESIUM: Magnesium: 2.2 mg/dL (ref 1.7–2.4)

## 2018-09-17 MED ORDER — LORATADINE 10 MG PO TABS
10.0000 mg | ORAL_TABLET | Freq: Every day | ORAL | Status: DC
  Administered 2018-09-17 – 2018-09-19 (×3): 10 mg via ORAL
  Filled 2018-09-17 (×3): qty 1

## 2018-09-17 MED ORDER — DILTIAZEM HCL 30 MG PO TABS
30.0000 mg | ORAL_TABLET | Freq: Four times a day (QID) | ORAL | Status: DC
  Administered 2018-09-17 – 2018-09-19 (×6): 30 mg via ORAL
  Filled 2018-09-17 (×7): qty 1

## 2018-09-17 MED ORDER — SALINE SPRAY 0.65 % NA SOLN
1.0000 | NASAL | Status: DC | PRN
  Filled 2018-09-17: qty 44

## 2018-09-17 MED ORDER — ARFORMOTEROL TARTRATE 15 MCG/2ML IN NEBU
15.0000 ug | INHALATION_SOLUTION | Freq: Two times a day (BID) | RESPIRATORY_TRACT | Status: DC
  Administered 2018-09-17 – 2018-09-19 (×5): 15 ug via RESPIRATORY_TRACT
  Filled 2018-09-17 (×5): qty 2

## 2018-09-17 MED ORDER — ALPRAZOLAM 0.25 MG PO TABS
0.2500 mg | ORAL_TABLET | Freq: Once | ORAL | Status: AC
  Administered 2018-09-17: 0.25 mg via ORAL
  Filled 2018-09-17: qty 1

## 2018-09-17 MED ORDER — METOPROLOL TARTRATE 5 MG/5ML IV SOLN
5.0000 mg | Freq: Once | INTRAVENOUS | Status: AC
  Administered 2018-09-17: 5 mg via INTRAVENOUS
  Filled 2018-09-17: qty 5

## 2018-09-17 NOTE — Progress Notes (Signed)
*  PRELIMINARY RESULTS* Echocardiogram 2D Echocardiogram has been performed.  Leavy Cella 09/17/2018, 3:38 PM

## 2018-09-17 NOTE — Progress Notes (Signed)
Pts HR increased to 150's, EKG obtained per Dr. Silas [place]. Pts rhythm read SVT with occasional PVCs. Metoprolol 5mg  IV given per MD order.  HR decreased to 78 and rhythm looks like Aflutter. Dr. Silas [place] made aware. Ok to still send patient to [facility].   Please change pts code status to DNR, order still says FULL CODE. Dr. Arturo Morton note says pt confirmed DNR STATUS.  PLEASE CHANGE

## 2018-09-17 NOTE — Progress Notes (Signed)
Patient moved up from ICU. On high flow at 6 lpm saturation 86. Increased back to 8 lpm added water bottle for humidity. Patient in chair as bed hurts back. Patient is on 6 lpm at home? He does not appear short of breath.

## 2018-09-17 NOTE — Progress Notes (Signed)
Dr. Silas [place] paged to see if pts code status could be changed due Dr. Arturo Morton note stating code status and note confirming, but order still for  FULL CODE. Waiting for orders/call back.

## 2018-09-17 NOTE — Progress Notes (Signed)
This RN called patients son Patrick Jupiter and adv of room change to [phone]. Phone number to room given to son

## 2018-09-17 NOTE — Progress Notes (Signed)
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 accentuated by portable technique. No acute traumatic finding. Electronically Signed   By: Lovena Le M.D.   On: 09/14/2018 19:50    Orson Eva, DO  [facility] Pager [phone]  If 7PM-7AM, please contact [email or web address] [email or web address] Password [email or web address] 09/17/2018, 2:06 PM   LOS: 2 days

## 2018-09-18 ENCOUNTER — Other Ambulatory Visit: Payer: Self-pay

## 2018-09-18 DIAGNOSIS — I4892 Unspecified atrial flutter: Secondary | ICD-10-CM

## 2018-09-18 LAB — BASIC METABOLIC PANEL
Anion gap: 10 (ref 5–15)
BUN: 36 mg/dL — ABNORMAL HIGH (ref 8–23)
CO2: 31 mmol/L (ref 22–32)
Calcium: 8.9 mg/dL (ref 8.9–10.3)
Chloride: 98 mmol/L (ref 98–111)
Creatinine, Ser: 0.79 mg/dL (ref 0.61–1.24)
GFR calc Af Amer: >60 mL/min (ref >60)
GFR calc non Af Amer: >60 mL/min (ref >60)
Glucose, Bld: 148 mg/dL — ABNORMAL HIGH (ref 70–99)
Potassium: 4.4 mmol/L (ref 3.5–5.1)
Sodium: 139 mmol/L (ref 135–145)

## 2018-09-18 LAB — PHOSPHORUS: Phosphorus: 3.3 mg/dL (ref 2.5–4.6)

## 2018-09-18 MED ORDER — GUAIFENESIN-DM 100-10 MG/5ML PO SYRP: 5.0000 mL | ORAL_SOLUTION | ORAL | Status: DC | PRN

## 2018-09-18 NOTE — Progress Notes (Signed)
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 parenchymal disease significantly limit the detection of acute underlying parenchymal processes. Prominence of the cardiomediastinal silhouette though this may be accentuated by portable technique. No acute traumatic finding. Electronically Signed   By: Kreg Shropshire M.D.   On: 09/14/2018 19:50    Catarina Hartshorn, DO  [facility] Pager [phone]  If 7PM-7AM, please contact night-coverage [email or web address] Password [email or web address] 09/18/2018, 5:01 PM   LOS: 3 days

## 2018-09-18 NOTE — Care Management Important Message (Signed)
Important Message  Patient Details  Name: Wayne [name] MRN: [id number] Date of Birth: 09/13/1921   Medicare Important Message Given:  Yes     Tommy Medal 09/18/2018, 3:18 PM

## 2018-09-18 NOTE — Progress Notes (Signed)
Patients BP 151/55, heart rate 49. MD notified. Instructed to hold Cardizem 0600 dose. Will continue to monitor.

## 2018-09-19 DIAGNOSIS — J449 Chronic obstructive pulmonary disease, unspecified: Secondary | ICD-10-CM

## 2018-09-19 LAB — BASIC METABOLIC PANEL
Anion gap: 9 (ref 5–15)
BUN: 43 mg/dL — ABNORMAL HIGH (ref 8–23)
CO2: 32 mmol/L (ref 22–32)
Calcium: 9.1 mg/dL (ref 8.9–10.3)
Chloride: 98 mmol/L (ref 98–111)
Creatinine, Ser: 0.98 mg/dL (ref 0.61–1.24)
GFR calc Af Amer: >60 mL/min (ref >60)
GFR calc non Af Amer: >60 mL/min (ref >60)
Glucose, Bld: 150 mg/dL — ABNORMAL HIGH (ref 70–99)
Potassium: 5.1 mmol/L (ref 3.5–5.1)
Sodium: 139 mmol/L (ref 135–145)

## 2018-09-19 LAB — MAGNESIUM: Magnesium: 2.3 mg/dL (ref 1.7–2.4)

## 2018-09-19 LAB — TSH: TSH: 0.659 u[IU]/mL (ref 0.350–4.500)

## 2018-09-19 MED ORDER — DILTIAZEM HCL ER COATED BEADS 120 MG PO CP24: 120.0000 mg | ORAL_CAPSULE | Freq: Every day | ORAL | 1 refills | Status: AC

## 2018-09-19 MED ORDER — PREDNISONE 10 MG PO TABS: 60.0000 mg | ORAL_TABLET | Freq: Every day | ORAL | 0 refills | Status: AC

## 2018-09-19 MED ORDER — DILTIAZEM HCL ER COATED BEADS 120 MG PO CP24
120.0000 mg | ORAL_CAPSULE | Freq: Every day | ORAL | Status: DC
  Administered 2018-09-19: 120 mg via ORAL
  Filled 2018-09-19: qty 1

## 2018-09-19 MED ORDER — LORATADINE 10 MG PO TABS: 10.0000 mg | ORAL_TABLET | Freq: Every day | ORAL | Status: AC

## 2018-09-19 MED ORDER — APIXABAN 5 MG PO TABS: 5.0000 mg | ORAL_TABLET | Freq: Two times a day (BID) | ORAL | 1 refills | Status: AC

## 2018-09-19 MED ORDER — APIXABAN 5 MG PO TABS
5.0000 mg | ORAL_TABLET | Freq: Two times a day (BID) | ORAL | Status: DC
  Administered 2018-09-19: 5 mg via ORAL
  Filled 2018-09-19: qty 1
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
## 2018-09-19 NOTE — Progress Notes (Signed)
Talked with patient's son, patient has O2 concentrator at home and worth advice from [place] he is able to set the O2 on 5 liters. Patient prepared for discharge, discharge instructions given and patient verbalized understanding. IV removed site WNL. Patient's son on the way to pick up patient for discharge.

## 2018-09-19 NOTE — Progress Notes (Signed)
Nsg Discharge Note  Admit Date:  09/14/2018 Discharge date: 09/19/2018   Joffre C Strassman to be D/C'd Home per MD order.  AVS completed.  Patient able to verbalize understanding.  Discharge Medication: Allergies as of 09/19/2018      Reactions   Codeine    cramping   Cortisone    cramping      Medication List    STOP taking these medications   amLODipine 10 MG tablet Commonly known as: NORVASC   aspirin EC 81 MG tablet   cephALEXin 500 MG capsule Commonly known as: KEFLEX   metoprolol tartrate 25 MG tablet Commonly known as: LOPRESSOR     TAKE these medications   albuterol 1.25 MG/3ML nebulizer solution Commonly known as: ACCUNEB Take 1 ampule by nebulization 2 (two) times daily as needed for wheezing or shortness of breath.   apixaban 5 MG Tabs tablet Commonly known as: ELIQUIS Take 1 tablet (5 mg total) by mouth 2 (two) times daily.   diltiazem 120 MG 24 hr capsule Commonly known as: CARDIZEM CD Take 1 capsule (120 mg total) by mouth daily. Start taking on: September 20, 2018   fluticasone 220 MCG/ACT inhaler Commonly known as: FLOVENT HFA Inhale 1 puff into the lungs daily.   HYDROcodone-acetaminophen 7.5-325 MG tablet Commonly known as: NORCO Take 1 tablet by mouth 4 (four) times daily.   isosorbide mononitrate 60 MG 24 hr tablet Commonly known as: IMDUR TAKE 1 TABLET BY MOUTH EVERY DAY   loratadine 10 MG tablet Commonly known as: CLARITIN Take 1 tablet (10 mg total) by mouth daily. Start taking on: September 20, 2018   MiraLax 17 GM/SCOOP powder Generic drug: polyethylene glycol powder Take 17 g by mouth daily. UAD   multivitamin tablet Take 1 tablet by mouth daily.   nitroGLYCERIN 0.4 MG SL tablet Commonly known as: NITROSTAT Place 1 tablet (0.4 mg total) under the tongue every 5 (five) minutes x 3 doses as needed.   NYSTATIN PO Take by mouth.   OCUVITE PO Take 1 tablet by mouth daily.   Omega-3 Krill Oil 300 MG Caps Take 1 capsule by mouth  daily. MEGA RED   OXYGEN Inhale 3 L into the lungs daily.   pravastatin 10 MG tablet Commonly known as: PRAVACHOL Take 10 mg daily by mouth.   predniSONE 10 MG tablet Commonly known as: DELTASONE Take 6 tablets (60 mg total) by mouth daily with breakfast. And decrease by one tablet daily   pregabalin 75 MG capsule Commonly known as: LYRICA Take 75 mg daily by mouth.   rOPINIRole 2 MG tablet Commonly known as: REQUIP Take 2 mg by mouth at bedtime.   tamsulosin 0.4 MG Caps capsule Commonly known as: FLOMAX Take 0.4 mg by mouth daily.   Trelegy Ellipta 100-62.5-25 MCG/INH Aepb Generic drug: Fluticasone-Umeclidin-Vilant Inhale 1 puff into the lungs daily.   vitamin B-12 1000 MCG tablet Commonly known as: CYANOCOBALAMIN Take 1,000 mcg by mouth daily.            Durable Medical Equipment  (From admission, onward)         Start     Ordered   09/19/18 1325  For home use only DME oxygen  Once    Question Answer Comment  Length of Need Lifetime   Mode or (Route) Nasal cannula   Liters per Minute 5   Frequency Continuous (stationary and portable oxygen unit needed)   Oxygen conserving device Yes   Oxygen delivery system Gas  09/19/18 1325          Discharge Assessment: Vitals:   09/19/18 1515 09/19/18 1948  BP: (!) 125/55   Pulse: 86   Resp: 16   Temp: 98.6 F (37 C)   SpO2: (!) 87% 94%    D/c Instructions-Education: Discharge instructions given to patient with verbalized understanding. D/c education completed with patient including follow up instructions, medication list, d/c activities limitations if indicated, with other d/c instructions as indicated by MD - patient able to verbalize understanding, all questions fully answered. Patient instructed to return to ED, call 911, or call MD for any changes in condition.   Jethro PolingBullins,Arlyne Brandes C, RN 09/19/2018 9:24 PM

## 2018-09-19 NOTE — TOC Transition Note (Signed)
Transition of Care [facility]) - CM/SW Discharge Note   Patient Details  Name: Wayne [name] MRN: [id number] Date of Birth: [birth year]  [facility] CM/SW Contact:  Ihor Gully, LCSW Phone Number: 09/19/2018, 11:58 AM   Clinical Narrative:    Patient declines HH. He states that he has people who will be assisting him and that his son is also going to increase assistance. He identified [employer] ([facility] as his DME oxygen provieder. Call placed to [facility] at [employer] to confirm that patient's home concentrator can go up to five liters.  Juliann Pulse with [employer] confirmed that patient would need a new concentrator if he needs 5L oxygen. Attending notified and order placed for concentrator.     Barriers to Discharge: No Barriers Identified   Patient Goals and CMS Choice Patient states their goals for this hospitalization and ongoing recovery are:: [place] wants to return home, unsure if he wants [facility] services CMS Medicare.gov Compare Post Acute Care list provided to:: Patient Choice offered to / list presented to : Patient  Discharge Placement                       Discharge Plan and Services In-house Referral: Clinical Social Work                                   Social Determinants of Health ([place]) Interventions     Readmission Risk Interventions No flowsheet data found.
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
## 2018-09-25 ENCOUNTER — Telehealth: Payer: Self-pay | Admitting: Cardiovascular Disease

## 2018-09-25 NOTE — Telephone Encounter (Signed)
Son informed and agrees to phone visit  Patient's son verbally consented for telehealth visits with [facility] and understands that his insurance company will be billed for the encounter.   Unable to check weight and BP but will have O2 Sat and HR available.

## 2018-09-25 NOTE — Telephone Encounter (Signed)
I think a virtual visit is appropriate given his age.

## 2018-09-25 NOTE — Telephone Encounter (Signed)
Son asked that Dr Bronson Ing come to patient's car to do his father's visit.  I offered virtual but they declined, wants to be seen in his car. He stated they will come in if Dr Bronson Ing will not do

## 2018-09-26 ENCOUNTER — Telehealth: Payer: Medicare Other | Admitting: Cardiovascular Disease

## 2018-09-26 ENCOUNTER — Encounter (HOSPITAL_COMMUNITY): Payer: Self-pay

## 2018-09-26 ENCOUNTER — Emergency Department (HOSPITAL_COMMUNITY): Payer: Medicare Other

## 2018-09-26 ENCOUNTER — Inpatient Hospital Stay (HOSPITAL_COMMUNITY)
Admission: EM | Admit: 2018-09-26 | Discharge: 2018-09-30 | DRG: 871 | Disposition: E | Payer: Medicare Other | Attending: Internal Medicine | Admitting: Internal Medicine

## 2018-09-26 ENCOUNTER — Other Ambulatory Visit: Payer: Self-pay

## 2018-09-26 DIAGNOSIS — A419 Sepsis, unspecified organism: Principal | ICD-10-CM | POA: Diagnosis present

## 2018-09-26 DIAGNOSIS — E785 Hyperlipidemia, unspecified: Secondary | ICD-10-CM | POA: Diagnosis present

## 2018-09-26 DIAGNOSIS — S81812A Laceration without foreign body, left lower leg, initial encounter: Secondary | ICD-10-CM | POA: Diagnosis present

## 2018-09-26 DIAGNOSIS — I10 Essential (primary) hypertension: Secondary | ICD-10-CM | POA: Diagnosis present

## 2018-09-26 DIAGNOSIS — D72829 Elevated white blood cell count, unspecified: Secondary | ICD-10-CM | POA: Diagnosis not present

## 2018-09-26 DIAGNOSIS — Z66 Do not resuscitate: Secondary | ICD-10-CM | POA: Diagnosis present

## 2018-09-26 DIAGNOSIS — Z20828 Contact with and (suspected) exposure to other viral communicable diseases: Secondary | ICD-10-CM | POA: Diagnosis present

## 2018-09-26 DIAGNOSIS — Z7951 Long term (current) use of inhaled steroids: Secondary | ICD-10-CM

## 2018-09-26 DIAGNOSIS — Z79899 Other long term (current) drug therapy: Secondary | ICD-10-CM | POA: Diagnosis not present

## 2018-09-26 DIAGNOSIS — I4892 Unspecified atrial flutter: Secondary | ICD-10-CM | POA: Diagnosis not present

## 2018-09-26 DIAGNOSIS — I272 Pulmonary hypertension, unspecified: Secondary | ICD-10-CM | POA: Diagnosis not present

## 2018-09-26 DIAGNOSIS — J189 Pneumonia, unspecified organism: Secondary | ICD-10-CM | POA: Diagnosis not present

## 2018-09-26 DIAGNOSIS — N400 Enlarged prostate without lower urinary tract symptoms: Secondary | ICD-10-CM | POA: Diagnosis present

## 2018-09-26 DIAGNOSIS — Z79891 Long term (current) use of opiate analgesic: Secondary | ICD-10-CM | POA: Diagnosis not present

## 2018-09-26 DIAGNOSIS — I2510 Atherosclerotic heart disease of native coronary artery without angina pectoris: Secondary | ICD-10-CM | POA: Diagnosis not present

## 2018-09-26 DIAGNOSIS — Z515 Encounter for palliative care: Secondary | ICD-10-CM | POA: Diagnosis not present

## 2018-09-26 DIAGNOSIS — Z9049 Acquired absence of other specified parts of digestive tract: Secondary | ICD-10-CM

## 2018-09-26 DIAGNOSIS — Z7901 Long term (current) use of anticoagulants: Secondary | ICD-10-CM

## 2018-09-26 DIAGNOSIS — J9621 Acute and chronic respiratory failure with hypoxia: Secondary | ICD-10-CM | POA: Diagnosis present

## 2018-09-26 DIAGNOSIS — R0602 Shortness of breath: Secondary | ICD-10-CM | POA: Diagnosis present

## 2018-09-26 DIAGNOSIS — J440 Chronic obstructive pulmonary disease with acute lower respiratory infection: Secondary | ICD-10-CM | POA: Diagnosis present

## 2018-09-26 DIAGNOSIS — K219 Gastro-esophageal reflux disease without esophagitis: Secondary | ICD-10-CM | POA: Diagnosis not present

## 2018-09-26 DIAGNOSIS — Y95 Nosocomial condition: Secondary | ICD-10-CM | POA: Diagnosis present

## 2018-09-26 DIAGNOSIS — R0600 Dyspnea, unspecified: Secondary | ICD-10-CM

## 2018-09-26 DIAGNOSIS — Z9981 Dependence on supplemental oxygen: Secondary | ICD-10-CM

## 2018-09-26 DIAGNOSIS — Z87891 Personal history of nicotine dependence: Secondary | ICD-10-CM | POA: Diagnosis not present

## 2018-09-26 DIAGNOSIS — I471 Supraventricular tachycardia: Secondary | ICD-10-CM | POA: Diagnosis not present

## 2018-09-26 DIAGNOSIS — R079 Chest pain, unspecified: Secondary | ICD-10-CM

## 2018-09-26 LAB — CBC WITH DIFFERENTIAL/PLATELET
Abs Immature Granulocytes: 0.7 10*3/uL — ABNORMAL HIGH (ref 0.00–0.07)
Basophils Absolute: 0.1 10*3/uL (ref 0.0–0.1)
Basophils Relative: 0 %
Eosinophils Absolute: 0 10*3/uL (ref 0.0–0.5)
Eosinophils Relative: 0 %
HCT: 41.5 % (ref 39.0–52.0)
Hemoglobin: 13.3 g/dL (ref 13.0–17.0)
Immature Granulocytes: 2 %
Lymphocytes Relative: 2 %
Lymphs Abs: 0.9 10*3/uL (ref 0.7–4.0)
MCH: 32.9 pg (ref 26.0–34.0)
MCHC: 32 g/dL (ref 30.0–36.0)
MCV: 102.7 fL — ABNORMAL HIGH (ref 80.0–100.0)
Monocytes Absolute: 1.5 10*3/uL — ABNORMAL HIGH (ref 0.1–1.0)
Monocytes Relative: 4 %
Neutro Abs: 37.4 10*3/uL — ABNORMAL HIGH (ref 1.7–7.7)
Neutrophils Relative %: 92 %
Platelets: 250 10*3/uL (ref 150–400)
RBC: 4.04 MIL/uL — ABNORMAL LOW (ref 4.22–5.81)
RDW: 13 % (ref 11.5–15.5)
WBC: 40.6 10*3/uL — ABNORMAL HIGH (ref 4.0–10.5)
nRBC: 0 % (ref 0.0–0.2)

## 2018-09-26 LAB — BLOOD GAS, ARTERIAL
Acid-Base Excess: 10.5 mmol/L — ABNORMAL HIGH (ref 0.0–2.0)
Bicarbonate: 33.1 mmol/L — ABNORMAL HIGH (ref 20.0–28.0)
FIO2: 40
O2 Saturation: 80.7 %
Patient temperature: 37.7
pCO2 arterial: 56.4 mmHg — ABNORMAL HIGH (ref 32.0–48.0)
pH, Arterial: 7.414 (ref 7.350–7.450)
pO2, Arterial: 48 mmHg — ABNORMAL LOW (ref 83.0–108.0)

## 2018-09-26 LAB — BRAIN NATRIURETIC PEPTIDE: B Natriuretic Peptide: 522 pg/mL — ABNORMAL HIGH (ref 0.0–100.0)

## 2018-09-26 LAB — URINALYSIS, ROUTINE W REFLEX MICROSCOPIC
Bacteria, UA: NONE SEEN
Bilirubin Urine: NEGATIVE
Glucose, UA: NEGATIVE mg/dL
Ketones, ur: NEGATIVE mg/dL
Leukocytes,Ua: NEGATIVE
Nitrite: NEGATIVE
Protein, ur: 30 mg/dL — AB
RBC / HPF: >50 RBC/hpf — ABNORMAL HIGH (ref 0–5)
Specific Gravity, Urine: 1.017 (ref 1.005–1.030)
pH: 6 (ref 5.0–8.0)

## 2018-09-26 LAB — TROPONIN I (HIGH SENSITIVITY): Troponin I (High Sensitivity): 225 ng/L (ref <18)

## 2018-09-26 LAB — COMPREHENSIVE METABOLIC PANEL
ALT: 36 U/L (ref 0–44)
AST: 24 U/L (ref 15–41)
Albumin: 3.6 g/dL (ref 3.5–5.0)
Alkaline Phosphatase: 60 U/L (ref 38–126)
Anion gap: 11 (ref 5–15)
BUN: 36 mg/dL — ABNORMAL HIGH (ref 8–23)
CO2: 31 mmol/L (ref 22–32)
Calcium: 8.6 mg/dL — ABNORMAL LOW (ref 8.9–10.3)
Chloride: 94 mmol/L — ABNORMAL LOW (ref 98–111)
Creatinine, Ser: 0.92 mg/dL (ref 0.61–1.24)
GFR calc Af Amer: >60 mL/min (ref >60)
GFR calc non Af Amer: >60 mL/min (ref >60)
Glucose, Bld: 80 mg/dL (ref 70–99)
Potassium: 4.6 mmol/L (ref 3.5–5.1)
Sodium: 136 mmol/L (ref 135–145)
Total Bilirubin: 2.7 mg/dL — ABNORMAL HIGH (ref 0.3–1.2)
Total Protein: 7.2 g/dL (ref 6.5–8.1)

## 2018-09-26 LAB — SARS CORONAVIRUS 2 BY RT PCR (HOSPITAL ORDER, PERFORMED IN ~~LOC~~ HOSPITAL LAB): SARS Coronavirus 2: NEGATIVE

## 2018-09-26 LAB — LACTIC ACID, PLASMA
Lactic Acid, Venous: 1.7 mmol/L (ref 0.5–1.9)
Lactic Acid, Venous: 1 mmol/L (ref 0.5–1.9)

## 2018-09-26 LAB — PROTIME-INR
INR: 1.4 — ABNORMAL HIGH (ref 0.8–1.2)
Prothrombin Time: 16.5 seconds — ABNORMAL HIGH (ref 11.4–15.2)

## 2018-09-26 LAB — MRSA PCR SCREENING: MRSA by PCR: POSITIVE — AB

## 2018-09-26 LAB — APTT: aPTT: 31 seconds (ref 24–36)

## 2018-09-26 MED ORDER — ROPINIROLE HCL 1 MG PO TABS
2.0000 mg | ORAL_TABLET | Freq: Every day | ORAL | Status: DC
  Administered 2018-09-26 – 2018-09-28 (×3): 2 mg via ORAL
  Filled 2018-09-26 (×7): qty 2

## 2018-09-26 MED ORDER — PRAVASTATIN SODIUM 10 MG PO TABS
10.0000 mg | ORAL_TABLET | Freq: Every day | ORAL | Status: DC
  Administered 2018-09-26 – 2018-09-28 (×3): 10 mg via ORAL
  Filled 2018-09-26 (×3): qty 1

## 2018-09-26 MED ORDER — VANCOMYCIN HCL 500 MG IV SOLR
500.0000 mg | Freq: Once | INTRAVENOUS | Status: AC
  Administered 2018-09-26: 500 mg via INTRAVENOUS
  Filled 2018-09-26: qty 500

## 2018-09-26 MED ORDER — SODIUM CHLORIDE 0.9 % IV SOLN: INTRAVENOUS | Status: AC

## 2018-09-26 MED ORDER — UMECLIDINIUM BROMIDE 62.5 MCG/INH IN AEPB
1.0000 | INHALATION_SPRAY | Freq: Every day | RESPIRATORY_TRACT | Status: DC
  Administered 2018-09-27 – 2018-09-29 (×3): 1 via RESPIRATORY_TRACT
  Filled 2018-09-26: qty 7

## 2018-09-26 MED ORDER — METRONIDAZOLE IN NACL 5-0.79 MG/ML-% IV SOLN
500.0000 mg | Freq: Three times a day (TID) | INTRAVENOUS | Status: DC
  Administered 2018-09-26 – 2018-09-29 (×10): 500 mg via INTRAVENOUS
  Filled 2018-09-26 (×11): qty 100

## 2018-09-26 MED ORDER — SODIUM CHLORIDE 0.9 % IV SOLN
2.0000 g | Freq: Two times a day (BID) | INTRAVENOUS | Status: DC
  Administered 2018-09-26 – 2018-09-29 (×6): 2 g via INTRAVENOUS
  Filled 2018-09-26 (×6): qty 2

## 2018-09-26 MED ORDER — ACETAMINOPHEN 325 MG PO TABS
650.0000 mg | ORAL_TABLET | Freq: Once | ORAL | Status: AC
  Administered 2018-09-26: 650 mg via ORAL
  Filled 2018-09-26: qty 2

## 2018-09-26 MED ORDER — FUROSEMIDE 10 MG/ML IJ SOLN
30.0000 mg | Freq: Once | INTRAMUSCULAR | Status: AC
  Administered 2018-09-26: 30 mg via INTRAVENOUS
  Filled 2018-09-26: qty 4

## 2018-09-26 MED ORDER — SODIUM CHLORIDE 0.9 % IV BOLUS
500.0000 mL | Freq: Once | INTRAVENOUS | Status: AC
  Administered 2018-09-26: 15:00:00 500 mL via INTRAVENOUS

## 2018-09-26 MED ORDER — VANCOMYCIN HCL IN DEXTROSE 1-5 GM/200ML-% IV SOLN
1000.0000 mg | Freq: Once | INTRAVENOUS | Status: AC
  Administered 2018-09-26: 1000 mg via INTRAVENOUS

## 2018-09-26 MED ORDER — APIXABAN 5 MG PO TABS
5.0000 mg | ORAL_TABLET | Freq: Two times a day (BID) | ORAL | Status: DC
  Administered 2018-09-26 – 2018-09-28 (×5): 5 mg via ORAL
  Filled 2018-09-26 (×5): qty 1
  Filled 2018-09-26: qty 2

## 2018-09-26 MED ORDER — TAMSULOSIN HCL 0.4 MG PO CAPS
0.4000 mg | ORAL_CAPSULE | Freq: Every day | ORAL | Status: DC
  Administered 2018-09-26 – 2018-09-28 (×3): 0.4 mg via ORAL
  Filled 2018-09-26 (×4): qty 1

## 2018-09-26 MED ORDER — VANCOMYCIN HCL IN DEXTROSE 1-5 GM/200ML-% IV SOLN
1000.0000 mg | Freq: Once | INTRAVENOUS | Status: DC
  Filled 2018-09-26: qty 200

## 2018-09-26 MED ORDER — FLUTICASONE-UMECLIDIN-VILANT 100-62.5-25 MCG/INH IN AEPB: 1.0000 | INHALATION_SPRAY | Freq: Every day | RESPIRATORY_TRACT | Status: DC

## 2018-09-26 MED ORDER — DILTIAZEM HCL ER COATED BEADS 120 MG PO CP24
120.0000 mg | ORAL_CAPSULE | Freq: Every day | ORAL | Status: DC
  Administered 2018-09-26 – 2018-09-28 (×3): 120 mg via ORAL
  Filled 2018-09-26 (×4): qty 1

## 2018-09-26 MED ORDER — PREGABALIN 75 MG PO CAPS
75.0000 mg | ORAL_CAPSULE | Freq: Every day | ORAL | Status: DC
  Administered 2018-09-26 – 2018-09-28 (×3): 75 mg via ORAL
  Filled 2018-09-26 (×4): qty 1

## 2018-09-26 MED ORDER — SODIUM CHLORIDE 0.9 % IV SOLN
2.0000 g | Freq: Once | INTRAVENOUS | Status: AC
  Administered 2018-09-26: 2 g via INTRAVENOUS
  Filled 2018-09-26: qty 2

## 2018-09-26 MED ORDER — ISOSORBIDE MONONITRATE ER 60 MG PO TB24
60.0000 mg | ORAL_TABLET | Freq: Every day | ORAL | Status: DC
  Administered 2018-09-26 – 2018-09-28 (×3): 60 mg via ORAL
  Filled 2018-09-26 (×4): qty 1

## 2018-09-26 MED ORDER — FLUTICASONE FUROATE-VILANTEROL 100-25 MCG/INH IN AEPB
1.0000 | INHALATION_SPRAY | Freq: Every day | RESPIRATORY_TRACT | Status: DC
  Administered 2018-09-27 – 2018-09-29 (×3): 1 via RESPIRATORY_TRACT
  Filled 2018-09-26: qty 28

## 2018-09-26 MED ORDER — VANCOMYCIN HCL 1.5 G IV SOLR
1500.0000 mg | Freq: Once | INTRAVENOUS | Status: DC
  Filled 2018-09-26: qty 1500

## 2018-09-26 MED ORDER — VITAMIN B-12 1000 MCG PO TABS
1000.0000 ug | ORAL_TABLET | Freq: Every day | ORAL | Status: DC
  Administered 2018-09-26 – 2018-09-28 (×3): 1000 ug via ORAL
  Filled 2018-09-26 (×4): qty 1

## 2018-09-26 MED ORDER — POLYETHYLENE GLYCOL 3350 17 G PO PACK
17.0000 g | PACK | Freq: Every day | ORAL | Status: DC
  Administered 2018-09-27 – 2018-09-29 (×3): 17 g via ORAL
  Filled 2018-09-26 (×3): qty 1

## 2018-09-26 MED ORDER — PANTOPRAZOLE SODIUM 40 MG PO TBEC
40.0000 mg | DELAYED_RELEASE_TABLET | Freq: Every day | ORAL | Status: DC
  Administered 2018-09-26 – 2018-09-28 (×3): 40 mg via ORAL
  Filled 2018-09-26 (×4): qty 1

## 2018-09-26 MED ORDER — VANCOMYCIN HCL IN DEXTROSE 1-5 GM/200ML-% IV SOLN
1000.0000 mg | INTRAVENOUS | Status: DC
  Administered 2018-09-27 – 2018-09-29 (×3): 1000 mg via INTRAVENOUS
  Filled 2018-09-26 (×3): qty 200

## 2018-09-26 MED ORDER — DOXYCYCLINE HYCLATE 100 MG PO TABS
100.0000 mg | ORAL_TABLET | Freq: Once | ORAL | Status: AC
  Administered 2018-09-26: 11:00:00 100 mg via ORAL
  Filled 2018-09-26: qty 1

## 2018-09-26 MED ORDER — LORATADINE 10 MG PO TABS
10.0000 mg | ORAL_TABLET | Freq: Every day | ORAL | Status: DC
  Administered 2018-09-26 – 2018-09-28 (×3): 10 mg via ORAL
  Filled 2018-09-26 (×4): qty 1
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
## 2018-09-26 NOTE — Progress Notes (Addendum)
Pharmacy Antibiotic Note  Wayne [name] is a 85 y.o. male admitted on 09/04/2018 with pneumonia.  Pharmacy has been consulted for Vancomycin and Cefepime dosing.  Plan:. Vancomycin 1500 mg IV x 1 dose. Vancomycin 1000 mg IV every 24 hours.  Goal trough 15-20 mcg/mL. Cefepime 2000 mg IV every 12 hours. Monitor labs, c/s, and vanco levels as indicated.  Height: 5\' 10"  (177.8 cm) Weight: 143 lb 4.8 oz (65 kg) IBW/kg (Calculated) : 73  Temp (24hrs), Avg:102.3 F (39.1 C), Min:102.3 F (39.1 C), Max:102.3 F (39.1 C)  Recent Labs  Lab 09/28/2018 1015 09/16/2018 1054  WBC  --  40.6*  CREATININE 0.92  --   LATICACIDVEN 1.7  --     Estimated Creatinine Clearance: 44.2 mL/min (by C-G formula based on SCr of 0.92 mg/dL).    Allergies  Allergen Reactions  . Codeine     cramping  . Cortisone     cramping    Antimicrobials this admission: Vanco 7/28 >> Cefepime 7/28 >>   Dose adjustments this admission: Cefepime/Vanco  Microbiology results: 7/28 BCx: pending 7/28 UCx: pending   7/28 MRSA PCR: pending  Thank you for allowing pharmacy to be a part of this patient's care.  Ramond Craver 09/07/2018 12:07 PM

## 2018-09-26 NOTE — Progress Notes (Signed)
PT retaining urine and having bladder spasms and pain. Bladder scan with over 1000 mL in bladder. Straight cathed patient with 1100 ML output. 125 residual
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
## 2018-09-26 NOTE — Progress Notes (Signed)
Patient is currently on 10L [place] with sats of 99%. Patient is tolerating well. BIPAP is not needed at this time. Will continue to monitor

## 2018-09-26 NOTE — Consult Note (Addendum)
[facility] Nurse wound consult note Consultation was completed by review of records, images and assistance from the bedside nurse/clinical staff.   Reason for Consult: left leg wound Wound type: lacerations (verified by admitting MD) Pressure Injury POA: NA Measurement: per ED H&P 4cm laceration; with remote consultation I am not able to assess depth. I have questioned if the laceration will be sutured; reported to be a week ok which would not be appropriate for sutures at this point Wound VOP:[id number] clean, pink, moist, subcutaneous tissue Drainage (amount, consistency, odor) unable to assess, no dressing in place  Periwound: no s/s  Of infection no erythema  Dressing procedure/placement/frequency:  1/4" iodoform packing strip for antibacterial properties to pack into wound and serve as a wick for drainage. Cover with foam or dry dressing.   Discussed POC with admitting hospitalist via secure chat Re consult if needed, will not follow at this time. Thanks  Gunnar Hereford [employer], RN,CWOCN, CNS, [place] [phone])
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
## 2018-09-27 ENCOUNTER — Inpatient Hospital Stay (HOSPITAL_COMMUNITY): Payer: Medicare Other

## 2018-09-27 LAB — CBC WITH DIFFERENTIAL/PLATELET
Basophils Absolute: 0 10*3/uL (ref 0.0–0.1)
Basophils Relative: 0 %
Eosinophils Absolute: 0 10*3/uL (ref 0.0–0.5)
Eosinophils Relative: 0 %
HCT: 31.3 % — ABNORMAL LOW (ref 39.0–52.0)
Hemoglobin: 9.8 g/dL — ABNORMAL LOW (ref 13.0–17.0)
Lymphocytes Relative: 2 %
Lymphs Abs: 0.6 10*3/uL — ABNORMAL LOW (ref 0.7–4.0)
MCHC: 31.3 g/dL (ref 30.0–36.0)
MCV: 105.7 fL — ABNORMAL HIGH (ref 80.0–100.0)
Monocytes Absolute: 1 10*3/uL (ref 0.1–1.0)
Monocytes Relative: 3 %
Neutro Abs: 29.1 10*3/uL — ABNORMAL HIGH (ref 1.7–7.7)
Neutrophils Relative %: 94 %
Platelets: ADEQUATE 10*3/uL (ref 150–400)
RBC: 2.96 MIL/uL — ABNORMAL LOW (ref 4.22–5.81)
RDW: 13.2 % (ref 11.5–15.5)
WBC: 31.1 10*3/uL — ABNORMAL HIGH (ref 4.0–10.5)

## 2018-09-27 LAB — COMPREHENSIVE METABOLIC PANEL
ALT: 23 U/L (ref 0–44)
AST: 23 U/L (ref 15–41)
Albumin: 2.5 g/dL — ABNORMAL LOW (ref 3.5–5.0)
Alkaline Phosphatase: 44 U/L (ref 38–126)
Anion gap: 8 (ref 5–15)
BUN: 36 mg/dL — ABNORMAL HIGH (ref 8–23)
CO2: 31 mmol/L (ref 22–32)
Calcium: 7.8 mg/dL — ABNORMAL LOW (ref 8.9–10.3)
Chloride: 96 mmol/L — ABNORMAL LOW (ref 98–111)
Creatinine, Ser: 0.91 mg/dL (ref 0.61–1.24)
GFR calc Af Amer: >60 mL/min (ref >60)
GFR calc non Af Amer: >60 mL/min (ref >60)
Glucose, Bld: 80 mg/dL (ref 70–99)
Potassium: 4.2 mmol/L (ref 3.5–5.1)
Sodium: 135 mmol/L (ref 135–145)
Total Bilirubin: 2.4 mg/dL — ABNORMAL HIGH (ref 0.3–1.2)
Total Protein: 5.2 g/dL — ABNORMAL LOW (ref 6.5–8.1)

## 2018-09-27 LAB — URINE CULTURE: Culture: NO GROWTH

## 2018-09-27 LAB — STREP PNEUMONIAE URINARY ANTIGEN: Strep Pneumo Urinary Antigen: NEGATIVE

## 2018-09-27 MED ORDER — LIP MEDEX EX OINT
1.0000 "application " | TOPICAL_OINTMENT | CUTANEOUS | Status: DC | PRN
  Filled 2018-09-27: qty 7

## 2018-09-27 MED ORDER — ORAL CARE MOUTH RINSE
15.0000 mL | Freq: Two times a day (BID) | OROMUCOSAL | Status: DC
  Administered 2018-09-27 – 2018-09-28 (×4): 15 mL via OROMUCOSAL

## 2018-09-27 MED ORDER — SENNOSIDES-DOCUSATE SODIUM 8.6-50 MG PO TABS: 2.0000 | ORAL_TABLET | Freq: Every evening | ORAL | Status: DC | PRN

## 2018-09-27 MED ORDER — PHENOL 1.4 % MT LIQD: 1.0000 | OROMUCOSAL | Status: DC | PRN

## 2018-09-27 MED ORDER — LORATADINE 10 MG PO TABS: 10.0000 mg | ORAL_TABLET | Freq: Every day | ORAL | Status: DC | PRN

## 2018-09-27 MED ORDER — ALUM & MAG HYDROXIDE-SIMETH 200-200-20 MG/5ML PO SUSP: 30.0000 mL | ORAL | Status: DC | PRN

## 2018-09-27 MED ORDER — CHLORHEXIDINE GLUCONATE 0.12 % MT SOLN
15.0000 mL | Freq: Two times a day (BID) | OROMUCOSAL | Status: DC
  Administered 2018-09-28 (×2): 15 mL via OROMUCOSAL
  Filled 2018-09-27 (×3): qty 15

## 2018-09-27 MED ORDER — HYDROCORTISONE (PERIANAL) 2.5 % EX CREA
1.0000 "application " | TOPICAL_CREAM | Freq: Four times a day (QID) | CUTANEOUS | Status: DC | PRN
  Filled 2018-09-27: qty 28.35

## 2018-09-27 MED ORDER — SALINE SPRAY 0.65 % NA SOLN: 1.0000 | NASAL | Status: DC | PRN

## 2018-09-27 MED ORDER — POLYVINYL ALCOHOL 1.4 % OP SOLN: 1.0000 [drp] | OPHTHALMIC | Status: DC | PRN

## 2018-09-27 MED ORDER — HYDRALAZINE HCL 20 MG/ML IJ SOLN: 10.0000 mg | INTRAMUSCULAR | Status: DC | PRN

## 2018-09-27 MED ORDER — MUPIROCIN 2 % EX OINT
1.0000 "application " | TOPICAL_OINTMENT | Freq: Two times a day (BID) | CUTANEOUS | Status: DC
  Administered 2018-09-28 – 2018-09-29 (×3): 1 via NASAL
  Filled 2018-09-27: qty 22

## 2018-09-27 MED ORDER — HYDROCODONE-ACETAMINOPHEN 5-325 MG PO TABS
1.0000 | ORAL_TABLET | Freq: Four times a day (QID) | ORAL | Status: DC | PRN
  Administered 2018-09-27 – 2018-09-28 (×4): 1 via ORAL
  Filled 2018-09-27 (×4): qty 1

## 2018-09-27 MED ORDER — HYDROCORTISONE 1 % EX CREA
1.0000 "application " | TOPICAL_CREAM | Freq: Three times a day (TID) | CUTANEOUS | Status: DC | PRN
  Filled 2018-09-27: qty 28

## 2018-09-27 MED ORDER — IPRATROPIUM-ALBUTEROL 0.5-2.5 (3) MG/3ML IN SOLN: 3.0000 mL | RESPIRATORY_TRACT | Status: DC | PRN

## 2018-09-27 MED ORDER — CHLORHEXIDINE GLUCONATE CLOTH 2 % EX PADS
6.0000 | MEDICATED_PAD | Freq: Every day | CUTANEOUS | Status: DC
  Administered 2018-09-27 – 2018-09-29 (×3): 6 via TOPICAL

## 2018-09-27 MED ORDER — MUSCLE RUB 10-15 % EX CREA
1.0000 "application " | TOPICAL_CREAM | CUTANEOUS | Status: DC | PRN
  Filled 2018-09-27: qty 85

## 2018-09-27 MED ORDER — GUAIFENESIN-DM 100-10 MG/5ML PO SYRP: 5.0000 mL | ORAL_SOLUTION | ORAL | Status: DC | PRN

## 2018-09-27 NOTE — TOC Initial Note (Signed)
Transition of Care [facility](TOC) - Initial/Assessment Note    Patient Details  Name: Wayne [name] MRN: [id number] Date of Birth: 10/13/1921  Transition of Care [facility](TOC) CM/SW Contact:    Asucena Galer, Chrystine OilerSharley Diane, RN Phone Number: 09/27/2018, 12:14 PM  Clinical Narrative:  CM consulted for home health. Reviewed chart, per H&P, admitting MD spoke with family, who is requesting SNF placement. Discussed with attending today, PT and OT eval placed.  Patient is independent at baseline.  He drives and ambulates independently.  He is on home oxygen at baseline. Refused home health last admission, unsure if he will be agreeable to SNF,  will wait for PT evaluation and discuss recommendation with patient.    Expected Discharge Plan: Skilled Nursing Facility Barriers to Discharge: SNF Pending bed offer   Patient Goals and CMS Choice        Expected Discharge Plan and Services Expected Discharge Plan: Skilled Nursing Facility                                              Prior Living Arrangements/Services                       Activities of Daily Living Home Assistive Devices/Equipment: Cane (specify quad or straight), Dentures (specify type), Oxygen, Walker (specify type) ADL Screening (condition at time of admission) Patient's cognitive ability adequate to safely complete daily activities?: No Is the patient deaf or have difficulty hearing?: Yes Does the patient have difficulty seeing, even when wearing glasses/contacts?: No Does the patient have difficulty concentrating, remembering, or making decisions?: No Patient able to express need for assistance with ADLs?: Yes Does the patient have difficulty dressing or bathing?: Yes Independently performs ADLs?: Yes (appropriate for developmental age) Does the patient have difficulty walking or climbing stairs?: Yes Weakness of Legs: Both Weakness of Arms/Hands: None  Permission Sought/Granted                  Emotional  Assessment              Admission diagnosis:  Shortness of breath [R06.02] Recurrent pneumonia [J18.9] Right-sided chest pain [R07.9] Sepsis, due to unspecified organism, unspecified whether acute organ dysfunction present [facility](HCC) [A41.9] Patient Active Problem List   Diagnosis Date Noted  . Acute and chronic respiratory failure with hypoxia (HCC) 2018/07/13  . GERD (gastroesophageal reflux disease)   . Bilateral pneumonia   . Leukocytosis   . Sepsis due to pneumonia (HCC)   . Atrial flutter (HCC) 09/18/2018  . SVT (supraventricular tachycardia) (HCC) 09/17/2018  . Rib fractures 09/15/2018  . Acute on chronic respiratory failure with hypoxia (HCC) 09/15/2018  . COPD with acute exacerbation (HCC) 09/15/2018  . Contusion of chest   . Hypoxia 09/14/2018  . Chronic low back pain   . HLD (hyperlipidemia)   . HTN (hypertension)   . Ejection fraction   . Pulmonary hypertension (HCC)   . EDEMA 01/21/2010  . ATHEROSLERO NATV ART EXTREM W/INTERMIT CLAUDICAT 07/10/2009  . Essential hypertension, benign 12/05/2008  . HYPERLIPIDEMIA-MIXED 11/19/2008  . CAD, NATIVE VESSEL 11/19/2008  . CHEST PAIN-UNSPECIFIED 11/19/2008   PCP:  Kirstie PeriShah, Ashish, MD Pharmacy:   [id number] - [place], [place] - [address] [facility] [address] [facility] [address] [place] [id number] Phone: [phone] Fax: [phone]     Social  Determinants of Health ([place]) Interventions    Readmission Risk Interventions Readmission Risk Prevention Plan 09/27/2018  Transportation Screening Complete  Social Work Consult for [place] Planning/Counseling [place] Not Applicable  Medication Review Press photographer) Complete  Some recent data might be hidden

## 2018-09-27 NOTE — Evaluation (Signed)
Clinical/Bedside Swallow Evaluation Patient Details  Name: Wayne [name] MRN: [id number] Date of Birth: 1921-08-07  Today's Date: 09/27/2018 Time: SLP Start Time (ACUTE ONLY): 1100 SLP Stop Time (ACUTE ONLY): 1131 SLP Time Calculation (min) (ACUTE ONLY): 31 min  Past Medical History:  Past Medical History:  Diagnosis Date  . Arthritis   . Chest pain, unspecified   . Chronic low back pain   . Coronary atherosclerosis of native coronary artery    Status post stent RCA, 2004  . Ejection fraction    EF 65%, echo, July, 2013  . GERD (gastroesophageal reflux disease)   . HTN (hypertension)   . Other and unspecified hyperlipidemia    mixed  . Peripheral neuropathy   . Pulmonary hypertension ([place])    Echo, July, 2013, right ventricular systolic pressure estimate 43 mmHg.   Past Surgical History:  Past Surgical History:  Procedure Laterality Date  . CATARACT EXTRACTION W/ INTRAOCULAR LENS IMPLANT Right   . CATARACT EXTRACTION W/PHACO Left 12/25/2013   Procedure: CATARACT EXTRACTION PHACO AND INTRAOCULAR LENS PLACEMENT (IOC);  Surgeon: Elta Guadeloupe T. Gershon Crane, MD;  Location: [facility];  Service: Ophthalmology;  Laterality: Left;  CDE:9.07  . CHOLECYSTECTOMY    . CORONARY ANGIOPLASTY WITH STENT PLACEMENT  2004   HPI:  Wayne [name] is a 85 y.o. male with oxygen requiring COPD, recently discharged AMA from a COPD exacerbation, now presents with chest pain and shortness of breath that started early this morning.  He arrives from home.  He is normally on 3-5L [place] oxygen but continued to have SOB and fever and chills.  He has chest pressure and discomfort in center of chest.  He has body aches and has general malaise.  He was worried about COVID but has not had known contacts.   Pt says that he has been taking his medications as prescribed since discharge.  He says he remains DNR/DNI. Reportedly he ate a meal in the ED he became hypoxic leaving the concern that he may have aspirated. BSE ordered to  determine LRD   Assessment / Plan / Recommendation Clinical Impression  Pt was provided clinical swallowing evaluation; Pt consumed thin liquids, puree and mechanical soft textures with no overt s/sx of aspiration with any textures or consistencies. Pt reports no episodes of "choking or getting strangled". Pt does not currently have his dentures and he does report difficulty with mastication without them. Pt reports family will be bringing his dentures today. Recommend continue with regular/thin diet and meds whole with liquids. SLP reviewed universal aspiration precautions including the importance of thorough masatication of solids, sitting upright and slow rate of consumtion. There are no further ST needs are noted at this time.  SLP Visit Diagnosis: Dysphagia, unspecified (R13.10)    Aspiration Risk  Mild aspiration risk    Diet Recommendation Regular;Thin liquid   Liquid Administration via: Straw;Cup Medication Administration: Whole meds with liquid Supervision: Patient able to self feed Compensations: Minimize environmental distractions;Slow rate;Small sips/bites Postural Changes: Seated upright at 90 degrees;Remain upright for at least 30 minutes after po intake    Other  Recommendations Oral Care Recommendations: Oral care BID   Follow up Recommendations None        Swallow Study   General HPI: Wayne [name] is a 85 y.o. male with oxygen requiring COPD, recently discharged AMA from a COPD exacerbation, now presents with chest pain and shortness of breath that started early this morning.  He arrives from home.  He is normally on  3-5L [place] oxygen but continued to have SOB and fever and chills.  He has chest pressure and discomfort in center of chest.  He has body aches and has general malaise.  He was worried about COVID but has not had known contacts.   Pt says that he has been taking his medications as prescribed since discharge.  He says he remains DNR/DNI. Reportedly he ate a  meal in the ED he became hypoxic leaving the concern that he may have aspirated. BSE ordered to determine LRD Type of Study: Bedside Swallow Evaluation Previous Swallow Assessment: none in chart Diet Prior to this Study: Regular;Thin liquids Temperature Spikes Noted: Yes Respiratory Status: Nasal cannula History of Recent Intubation: No Behavior/Cognition: Alert;Cooperative;Pleasant mood Oral Cavity Assessment: Within Functional Limits Oral Care Completed by SLP: Recent completion by staff Oral Cavity - Dentition: Edentulous Vision: Functional for self-feeding Self-Feeding Abilities: Able to feed self;Needs set up;Needs assist Patient Positioning: Upright in chair Baseline Vocal Quality: Normal Volitional Cough: Strong Volitional Swallow: Able to elicit    Oral/Motor/Sensory Function Overall Oral Motor/Sensory Function: Within functional limits   Ice Chips Ice chips: Within functional limits   Thin Liquid Thin Liquid: Within functional limits    Nectar Thick Nectar Thick Liquid: Not tested   Honey Thick Honey Thick Liquid: Not tested   Puree Puree: Within functional limits   Solid     H. Romie Levee MA, CCC-SLP Speech Language Pathologist  Solid: Within functional limits      Georgetta Haber H  09/27/2018,11:35 AM

## 2018-09-27 NOTE — Progress Notes (Signed)
Patient currently on 10LNC with sats of 94% and resting comfortably. BIPAP is not needed at this time. Will continue to monitor

## 2018-09-27 NOTE — Plan of Care (Signed)
  Problem: Acute Rehab PT Goals(only PT should resolve) Goal: Patient Will Transfer Sit To/From Stand Outcome: Progressing Flowsheets (Taken 09/27/2018 1546) Patient will transfer sit to/from stand: with modified independence Note: With RW Goal: Pt Will Transfer Bed To Chair/Chair To Bed Outcome: Progressing Flowsheets (Taken 09/27/2018 1546) Pt will Transfer Bed to Chair/Chair to Bed: with modified independence Note: With RW Goal: Pt Will Ambulate Outcome: Progressing Flowsheets (Taken 09/27/2018 1546) Pt will Ambulate:  100 feet  with supervision  with rolling walker Goal: Pt Will Go Up/Down Stairs Outcome: Progressing Flowsheets (Taken 09/27/2018 1546) Pt will Go Up / Down Stairs:  1-2 stairs  with supervision  with rail(s)   Talbot Grumbling PT, DPT 09/27/18, 3:48 PM [phone]

## 2018-09-27 NOTE — Evaluation (Signed)
Physical Therapy Evaluation Patient Details Name: Wayne [name] MRN: [id number] DOB: Jul 17, 1921 Today's Date: 09/27/2018   History of Present Illness  Wayne [name] is a 85 y.o. male with oxygen requiring COPD, recently discharged AMA from a COPD exacerbation, now presents with chest pain and shortness of breath that started early this morning.  He arrives from home.  He is normally on 3-5L [employer] oxygen but continued to have SOB and fever and chills.  He has chest pressure and discomfort in center of chest.  He has body aches and has general malaise.  He was worried about COVID but has not had known contacts.   Pt says that he has been taking his medications as prescribed since discharge.  He says he remains DNR/DNI.    Clinical Impression  Pt is a pleasant, motivated 85YO male who is currently on 10L O2 versus his usual 3-5LPM O2 at home and continues to desat and fatigue with mobility. Pt able to perform bed mobility without assistance, requiring increased time and use of bedrail. Pt desaturated to 80% while seated EOB but able to improve to 88% with seated rest, pursed lip breathing and 5 minutes; pt denies lightheadedness or chest pain. Pt reports he normally saturates around 80% at home when on 3-5LPM O2. Pt performs sit<>stand transfers and stand pivot transfers with RW and SUPV assist using proper hand placement and steadiness upon standing, initially demonstrating decreased LLE weightbearing due to soreness complaints but able to improve with verbal cues. Pt ambulates around hospital room completing 180 degree turns and navigating around furniture, no unsteadiness noted, mild SOB, decreased heel-toe pattern and shortened bil step length. Pt on 10LPM O2 with saturation varying from 78-85% but pt reports feeling "fine" . Pt tolerates remaining up in chair with call bell in lap and O2 91% at EOS. Patient near baseline for functional mobility and gait, slightly limited secondary to generalized  weakness and fatigue with activity. Patient will benefit from continued physical therapy in hospital and HHPT with family to assist to increase strength, balance, endurance for safe ADLs and gait.    Follow Up Recommendations Home health PT;Supervision/Assistance - 24 hour;Supervision for mobility/OOB    Equipment Recommendations       Recommendations for Other Services       Precautions / Restrictions Precautions Precautions: Fall Restrictions Weight Bearing Restrictions: No      Mobility  Bed Mobility Overal bed mobility: Modified Independent             General bed mobility comments: increased time, use of bedrail  Transfers Overall transfer level: Needs assistance Equipment used: Rolling walker (2 wheeled) Transfers: Sit to/from [employer] Transfers Sit to Stand: Supervision Stand pivot transfers: Supervision       General transfer comment: slow, labored movement, proper hand placement and steadiness upon standing  Ambulation/Gait Ambulation/Gait assistance: Supervision Gait Distance (Feet): 15 Feet Assistive device: Rolling walker (2 wheeled) Gait Pattern/deviations: Step-to pattern;Decreased step length - right;Decreased step length - left;Decreased stride length Gait velocity: decreased   General Gait Details: slow, labored cadence, mild SOB with gait, limlited by BLE pain/soreness (L>R)  Stairs            Wheelchair Mobility    Modified Rankin (Stroke Patients Only)       Balance Overall balance assessment: Needs assistance Sitting-balance support: Feet supported;No upper extremity supported Sitting balance-Leahy Scale: Good Sitting balance - Comments: seated EOB Postural control: (kyphotic posture)   Standing balance-Leahy Scale: Fair Standing balance  comment: with RW                             Pertinent Vitals/Pain Pain Assessment: 0-10 Pain Score: 5  Pain Location: BLE (L>R) Pain Descriptors / Indicators:  Aching;Sore Pain Intervention(s): Limited activity within patient's tolerance;Monitored during session;Repositioned    Home Living Family/patient expects to be discharged to:: Private residence Living Arrangements: Alone Available Help at Discharge: Family;Available 24 hours/day;Personal care attendant Type of Home: House Home Access: Stairs to enter Entrance Stairs-Rails: Right Entrance Stairs-Number of Steps: 2 Home Layout: One level Home Equipment: Walker - 2 wheels;Cane - single point;Bedside commode;Grab bars - toilet;Grab bars - tub/shower;Wheelchair - manual      Prior Function Level of Independence: Independent with assistive device(s);Needs assistance   Gait / Transfers Assistance Needed: Pt reports Ind with household ambulation using RW and transfers; sleeps in lift chair  ADL's / Homemaking Assistance Needed: Pt reports paid help for lawncare and housecleaning, Ind with cooking, self care, and driving        Hand Dominance        Extremity/Trunk Assessment   Upper Extremity Assessment Upper Extremity Assessment: Defer to OT evaluation    Lower Extremity Assessment Lower Extremity Assessment: Overall WFL for tasks assessed(denies numbness/tingling but tender to touch throughout bil lower legs and feet)    Cervical / Trunk Assessment Cervical / Trunk Assessment: Kyphotic  Communication   Communication: No difficulties  Cognition Arousal/Alertness: Awake/alert Behavior During Therapy: WFL for tasks assessed/performed Overall Cognitive Status: Within Functional Limits for tasks assessed                                        General Comments      Exercises     Assessment/Plan    PT Assessment Patient needs continued PT services  PT Problem List Decreased strength;Decreased activity tolerance;Decreased balance;Cardiopulmonary status limiting activity;Pain       PT Treatment Interventions DME instruction;Gait training;Stair  training;Functional mobility training;Therapeutic activities;Therapeutic exercise;Balance training;Neuromuscular re-education;Patient/family education    PT Goals (Current goals can be found in the Care Plan section)  Acute Rehab PT Goals Patient Stated Goal: return home PT Goal Formulation: With patient Time For Goal Achievement: 10/04/18 Potential to Achieve Goals: Good    Frequency Min 3X/week   Barriers to discharge        Co-evaluation               AM-PAC PT "6 Clicks" Mobility  Outcome Measure Help needed turning from your back to your side while in a flat bed without using bedrails?: None Help needed moving from lying on your back to sitting on the side of a flat bed without using bedrails?: None Help needed moving to and from a bed to a chair (including a wheelchair)?: A Little Help needed standing up from a chair using your arms (e.g., wheelchair or bedside chair)?: A Little Help needed to walk in hospital room?: A Little Help needed climbing 3-5 steps with a railing? : A Lot 6 Click Score: 19    End of Session Equipment Utilized During Treatment: Gait belt;Oxygen(10L O2 via nasal cannula) Activity Tolerance: Patient tolerated treatment well;No increased pain Patient left: in chair;with call bell/phone within reach Nurse Communication: Mobility status PT Visit Diagnosis: Unsteadiness on feet (R26.81);Other abnormalities of gait and mobility (R26.89);Muscle weakness (generalized) (M62.81)  Time: 1440-1510 PT Time Calculation (min) (ACUTE ONLY): 30 min   Charges:   PT Evaluation $PT Eval Moderate Complexity: 1 Mod          Tori Sheniece Ruggles PT, DPT 09/27/18, 3:46 PM [phone]
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
## 2018-09-28 LAB — COMPREHENSIVE METABOLIC PANEL
ALT: 21 U/L (ref 0–44)
AST: 20 U/L (ref 15–41)
Albumin: 2.4 g/dL — ABNORMAL LOW (ref 3.5–5.0)
Alkaline Phosphatase: 47 U/L (ref 38–126)
Anion gap: 5 (ref 5–15)
BUN: 36 mg/dL — ABNORMAL HIGH (ref 8–23)
CO2: 32 mmol/L (ref 22–32)
Calcium: 7.8 mg/dL — ABNORMAL LOW (ref 8.9–10.3)
Chloride: 97 mmol/L — ABNORMAL LOW (ref 98–111)
Creatinine, Ser: 0.93 mg/dL (ref 0.61–1.24)
GFR calc Af Amer: >60 mL/min (ref >60)
GFR calc non Af Amer: >60 mL/min (ref >60)
Glucose, Bld: 126 mg/dL — ABNORMAL HIGH (ref 70–99)
Potassium: 4.2 mmol/L (ref 3.5–5.1)
Sodium: 134 mmol/L — ABNORMAL LOW (ref 135–145)
Total Bilirubin: 1.7 mg/dL — ABNORMAL HIGH (ref 0.3–1.2)
Total Protein: 5.3 g/dL — ABNORMAL LOW (ref 6.5–8.1)

## 2018-09-28 LAB — CBC WITH DIFFERENTIAL/PLATELET
Abs Immature Granulocytes: 0.15 10*3/uL — ABNORMAL HIGH (ref 0.00–0.07)
Basophils Absolute: 0 10*3/uL (ref 0.0–0.1)
Basophils Relative: 0 %
Eosinophils Absolute: 0 10*3/uL (ref 0.0–0.5)
Eosinophils Relative: 0 %
HCT: 30.2 % — ABNORMAL LOW (ref 39.0–52.0)
Hemoglobin: 9.4 g/dL — ABNORMAL LOW (ref 13.0–17.0)
Immature Granulocytes: 1 %
Lymphocytes Relative: 2 %
Lymphs Abs: 0.4 10*3/uL — ABNORMAL LOW (ref 0.7–4.0)
MCH: 33 pg (ref 26.0–34.0)
MCHC: 31.1 g/dL (ref 30.0–36.0)
MCV: 106 fL — ABNORMAL HIGH (ref 80.0–100.0)
Monocytes Absolute: 0.6 10*3/uL (ref 0.1–1.0)
Monocytes Relative: 3 %
Neutro Abs: 17.6 10*3/uL — ABNORMAL HIGH (ref 1.7–7.7)
Neutrophils Relative %: 94 %
Platelets: 157 10*3/uL (ref 150–400)
RBC: 2.85 MIL/uL — ABNORMAL LOW (ref 4.22–5.81)
RDW: 12.8 % (ref 11.5–15.5)
WBC: 18.8 10*3/uL — ABNORMAL HIGH (ref 4.0–10.5)
nRBC: 0 % (ref 0.0–0.2)

## 2018-09-28 LAB — BRAIN NATRIURETIC PEPTIDE: B Natriuretic Peptide: 384 pg/mL — ABNORMAL HIGH (ref 0.0–100.0)

## 2018-09-28 LAB — PROCALCITONIN: Procalcitonin: 5.04 ng/mL

## 2018-09-28 LAB — MAGNESIUM: Magnesium: 2 mg/dL (ref 1.7–2.4)

## 2018-09-28 MED ORDER — IPRATROPIUM-ALBUTEROL 0.5-2.5 (3) MG/3ML IN SOLN
3.0000 mL | Freq: Four times a day (QID) | RESPIRATORY_TRACT | Status: DC
  Administered 2018-09-28 – 2018-09-29 (×5): 3 mL via RESPIRATORY_TRACT
  Filled 2018-09-28 (×6): qty 3

## 2018-09-28 NOTE — Progress Notes (Signed)
Changed dressing to lower left leg per order. Patient tolerated well.

## 2018-09-28 NOTE — Progress Notes (Signed)
Physical Therapy Treatment Patient Details Name: Wayne [name] MRN: [id number] DOB: October 05, 1921 Today's Date: 09/28/2018    History of Present Illness Wayne [name] is a 85 y.o. male with oxygen requiring COPD, recently discharged AMA from a COPD exacerbation, now presents with chest pain and shortness of breath that started early this morning.  He arrives from home.  He is normally on 3-5L [place] oxygen but continued to have SOB and fever and chills.  He has chest pressure and discomfort in center of chest.  He has body aches and has general malaise.  He was worried about COVID but has not had known contacts.   Pt says that he has been taking his medications as prescribed since discharge.  He says he remains DNR/DNI.    PT Comments    Pt presents with mild SOB seated up in chair upon arrival and motivated to ambulate with therapy. Pt demonstrates increased weakness with transfers using rocking momentum and BLE bracing on front of chair to rise. Pt able to ambulate 20 ft with RW, but required 2 standing rest breaks and 1 seated rest break due to severe fatigue. Pt with varying O2 sat ranging from 78-92% with gait training, able to converse but very short winded requiring seated rest break to recover. Pt reports "giddy headed" feeling multiple times during session and RN in room at [facility] noticing pt's SOB, weakness, and frustration with current physical status. PT and RN educated pt on benefits of SNF due to increasing weakness and poor endurance and pt agreeable to rehab increase strength, balance, endurance for safe ADLs and gait. Pt admits not eating well while in hospital, so educated to increase intake to improve strength and pt verbalized understanding. Pt tolerates remaining up in chair with RN in room hooking up pt's IV. Due to pt's decline in endurance and strength, updated rec to SNF this date. Pt would benefit from continued skilled PT interventions while in hospital.    Follow Up  Recommendations  SNF      Equipment Recommendations       Recommendations for Other Services       Precautions / Restrictions Precautions Precautions: Fall Restrictions Weight Bearing Restrictions: No    Mobility  Bed Mobility Overal bed mobility: Modified Independent             General bed mobility comments: pt up in bedside chair upon arrival  Transfers Overall transfer level: Needs assistance Equipment used: Rolling walker (2 wheeled) Transfers: Sit to/from [facility] Sit to Stand: Min guard Stand pivot transfers: Min guard       General transfer comment: slow, labored movement, proper hand placement, required momentum to rise and BLE bracing on chair, unsteadiness upon standing  Ambulation/Gait Ambulation/Gait assistance: Min guard;Min assist Gait Distance (Feet): 20 Feet Assistive device: Rolling walker (2 wheeled) Gait Pattern/deviations: Step-to pattern;Decreased step length - right;Decreased step length - left;Decreased stride length Gait velocity: decreased   General Gait Details: slow, labored cadence, severe SOB with gait, x2 standing  and x1 seated rest breaks, increased assistance with increased fatigue   Stairs             Wheelchair Mobility    Modified Rankin (Stroke Patients Only)       Balance Overall balance assessment: Needs assistance Sitting-balance support: Feet supported;No upper extremity supported Sitting balance-Leahy Scale: Good Sitting balance - Comments: seated EOB Postural control: (kyphotic posture)   Standing balance-Leahy Scale: Fair Standing balance comment: with RW  Cognition Arousal/Alertness: Awake/alert Behavior During Therapy: WFL for tasks assessed/performed Overall Cognitive Status: Within Functional Limits for tasks assessed                                        Exercises      General Comments        Pertinent  Vitals/Pain Pain Assessment: No/denies pain    Home Living Family/patient expects to be discharged to:: Private residence Living Arrangements: Alone Available Help at Discharge: Family;Available 24 hours/day;Personal care attendant Type of Home: House Home Access: Stairs to enter Entrance Stairs-Rails: Right Home Layout: One level Home Equipment: Walker - 2 wheels;Cane - single point;Bedside commode;Grab bars - toilet;Grab bars - tub/shower;Wheelchair - manual      Prior Function Level of Independence: Independent with assistive device(s);Needs assistance  Gait / Transfers Assistance Needed: Pt reports Ind with household ambulation using RW and transfers; sleeps in lift chair ADL's / Homemaking Assistance Needed: Pt reports paid help for lawncare and housecleaning, Ind with cooking, self care, and driving     PT Goals (current goals can now be found in the care plan section) Acute Rehab PT Goals Patient Stated Goal: return home PT Goal Formulation: With patient Time For Goal Achievement: 10/11/18 Potential to Achieve Goals: Good Progress towards PT goals: Not progressing toward goals - comment(Pt reporting "giddy headed" this AM, mild SOB in sitting and severe with gait, increased assistance with gait)    Frequency    Min 3X/week      PT Plan Discharge plan needs to be updated    Co-evaluation              AM-PAC PT "6 Clicks" Mobility   Outcome Measure  Help needed turning from your back to your side while in a flat bed without using bedrails?: None Help needed moving from lying on your back to sitting on the side of a flat bed without using bedrails?: None Help needed moving to and from a bed to a chair (including a wheelchair)?: A Little Help needed standing up from a chair using your arms (e.g., wheelchair or bedside chair)?: A Little Help needed to walk in hospital room?: A Lot Help needed climbing 3-5 steps with a railing? : A Lot 6 Click Score: 18    End  of Session Equipment Utilized During Treatment: Gait belt;Oxygen(10L O2 via nasal cannula) Activity Tolerance: Patient limited by fatigue Patient left: in chair;with call bell/phone within reach Nurse Communication: Mobility status PT Visit Diagnosis: Unsteadiness on feet (R26.81);Other abnormalities of gait and mobility (R26.89);Muscle weakness (generalized) (M62.81)     Time: [id number] PT Time Calculation (min) (ACUTE ONLY): 30 min  Charges:  $Gait Training: 8-22 mins $Therapeutic Activity: 8-22 mins                     Tori Gracyn Santillanes PT, DPT 09/28/18, 9:34 AM [phone]

## 2018-09-28 NOTE — [place] FL2 (Signed)
[place] MEDICAID FL2 LEVEL OF CARE SCREENING TOOL     IDENTIFICATION  Patient Name: Wayne [name] Birthdate: 02/15/1922 Sex: male Admission Date (Current Location): Apr 11, 2018  [facility] [place]   Facility and Address:  [facility],  [address], [place] [id number]      Provider Number: [id number]  Attending Physician Name and Address:  Dimple NanasAmin, Ankit Chirag, MD  Relative Name and Phone Number:       Current Level of Care: Hospital Recommended Level of Care: Skilled Nursing Facility Prior Approval Number:    Date Approved/Denied:   PASRR Number: [id number]  Discharge Plan: SNF    Current Diagnoses: Patient Active Problem List   Diagnosis Date Noted  . Acute and chronic respiratory failure with hypoxia (HCC) 0Feb 11, 2020  . GERD (gastroesophageal reflux disease)   . Bilateral pneumonia   . Leukocytosis   . Sepsis due to pneumonia (HCC)   . Atrial flutter (HCC) 09/18/2018  . SVT (supraventricular tachycardia) (HCC) 09/17/2018  . Rib fractures 09/15/2018  . Acute on chronic respiratory failure with hypoxia (HCC) 09/15/2018  . COPD with acute exacerbation (HCC) 09/15/2018  . Contusion of chest   . Hypoxia 09/14/2018  . Chronic low back pain   . HLD (hyperlipidemia)   . HTN (hypertension)   . Ejection fraction   . Pulmonary hypertension (HCC)   . EDEMA 01/21/2010  . ATHEROSLERO NATV ART EXTREM W/INTERMIT CLAUDICAT 07/10/2009  . Essential hypertension, benign 12/05/2008  . HYPERLIPIDEMIA-MIXED 11/19/2008  . CAD, NATIVE VESSEL 11/19/2008  . CHEST PAIN-UNSPECIFIED 11/19/2008    Orientation RESPIRATION BLADDER Height & Weight     Self, Time, Situation, Place  O2(6L/M) Continent Weight: 63.8 kg Height:  5\' 10"  (177.8 cm)  BEHAVIORAL SYMPTOMS/MOOD NEUROLOGICAL BOWEL NUTRITION STATUS  (none) (none) Continent Diet(Heart Healthy)  AMBULATORY STATUS COMMUNICATION OF NEEDS Skin   Extensive Assist Verbally Other  (Comment)(Laceration-L leg-lateral, Deep tissue injury-L heel)                       Personal Care Assistance Level of Assistance  Bathing, Feeding, Dressing Bathing Assistance: Limited assistance Feeding assistance: Independent Dressing Assistance: Limited assistance     Functional Limitations Info  Sight, Hearing, Speech Sight Info: Adequate Hearing Info: Adequate Speech Info: Adequate    SPECIAL CARE FACTORS FREQUENCY  PT (By licensed PT)     PT Frequency: 5X/W              Contractures Contractures Info: Not present    Additional Factors Info  Code Status, Allergies Code Status Info: DNR Allergies Info: Codeine, Cortisone           Current Medications (09/28/2018):  This is the current hospital active medication list Current Facility-Administered Medications  Medication Dose Route Frequency Provider Last Rate Last Dose  . alum & mag hydroxide-simeth (MAALOX/MYLANTA) 200-200-20 MG/5ML suspension 30 mL  30 mL Oral Q4H PRN Amin, Ankit Chirag, MD      . apixaban (ELIQUIS) tablet 5 mg  5 mg Oral BID Laural BenesJohnson, Clanford L, MD   5 mg at 09/28/18 0909  . ceFEPIme (MAXIPIME) 2 g in sodium chloride 0.9 % 100 mL IVPB  2 g Intravenous Q12H Johnson, Clanford L, MD 200 mL/hr at 09/28/18 1149 2 g at 09/28/18 1149  . chlorhexidine (PERIDEX) 0.12 % solution 15 mL  15 mL Mouth Rinse BID Johnson, Clanford L, MD   15 mL at 09/28/18 0908  . Chlorhexidine Gluconate Cloth 2 %  PADS 6 each  6 each Topical Daily Murlean Iba, MD   6 each at 09/28/18 0910  . diltiazem (CARDIZEM CD) 24 hr capsule 120 mg  120 mg Oral Daily Johnson, Clanford L, MD   120 mg at 09/28/18 0909  . fluticasone furoate-vilanterol (BREO ELLIPTA) 100-25 MCG/INH 1 puff  1 puff Inhalation Daily Johnson, Clanford L, MD   1 puff at 09/28/18 1101  . guaiFENesin-dextromethorphan (ROBITUSSIN DM) 100-10 MG/5ML syrup 5 mL  5 mL Oral Q4H PRN Amin, Ankit Chirag, MD      . hydrALAZINE (APRESOLINE) injection 10 mg  10 mg  Intravenous Q4H PRN Amin, Ankit Chirag, MD      . HYDROcodone-acetaminophen (NORCO/VICODIN) 5-325 MG per tablet 1 tablet  1 tablet Oral Q6H PRN Oswald Hillock, MD   1 tablet at 09/27/18 2155  . hydrocortisone (ANUSOL-HC) 2.5 % rectal cream 1 application  1 application Topical QID PRN Amin, Ankit Chirag, MD      . hydrocortisone cream 1 % 1 application  1 application Topical TID PRN Amin, Ankit Chirag, MD      . ipratropium-albuterol (DUONEB) 0.5-2.5 (3) MG/3ML nebulizer solution 3 mL  3 mL Nebulization Q4H PRN Amin, Ankit Chirag, MD      . ipratropium-albuterol (DUONEB) 0.5-2.5 (3) MG/3ML nebulizer solution 3 mL  3 mL Nebulization Q6H Amin, Ankit Chirag, MD      . isosorbide mononitrate (IMDUR) 24 hr tablet 60 mg  60 mg Oral Daily Johnson, Clanford L, MD   60 mg at 09/28/18 0909  . lip balm (CARMEX) ointment 1 application  1 application Topical PRN Amin, Ankit Chirag, MD      . loratadine (CLARITIN) tablet 10 mg  10 mg Oral Daily Johnson, Clanford L, MD   10 mg at 09/28/18 0908  . MEDLINE mouth rinse  15 mL Mouth Rinse q12n4p Johnson, Clanford L, MD   15 mL at 09/28/18 1150  . metroNIDAZOLE (FLAGYL) IVPB 500 mg  500 mg Intravenous Q8H Johnson, Clanford L, MD 100 mL/hr at 09/28/18 0915 500 mg at 09/28/18 0915  . mupirocin ointment (BACTROBAN) 2 % 1 application  1 application Nasal BID Murlean Iba, MD   1 application at 78/29/56 0910  . Muscle Rub CREA 1 application  1 application Topical PRN Amin, Ankit Chirag, MD      . pantoprazole (PROTONIX) EC tablet 40 mg  40 mg Oral Daily Johnson, Clanford L, MD   40 mg at 09/28/18 0909  . phenol (CHLORASEPTIC) mouth spray 1 spray  1 spray Mouth/Throat PRN Amin, Ankit Chirag, MD      . polyethylene glycol (MIRALAX / GLYCOLAX) packet 17 g  17 g Oral Daily Johnson, Clanford L, MD   17 g at 09/28/18 0908  . polyvinyl alcohol (LIQUIFILM TEARS) 1.4 % ophthalmic solution 1 drop  1 drop Both Eyes PRN Amin, Ankit Chirag, MD      . pravastatin (PRAVACHOL) tablet  10 mg  10 mg Oral q1800 Johnson, Clanford L, MD   10 mg at 09/27/18 1700  . pregabalin (LYRICA) capsule 75 mg  75 mg Oral Daily Johnson, Clanford L, MD   75 mg at 09/28/18 0909  . rOPINIRole (REQUIP) tablet 2 mg  2 mg Oral QHS Johnson, Clanford L, MD   2 mg at 09/27/18 2152  . senna-docusate (Senokot-S) tablet 2 tablet  2 tablet Oral QHS PRN Amin, Ankit Chirag, MD      . sodium chloride (OCEAN) 0.65 % nasal spray 1  spray  1 spray Each Nare PRN Amin, Ankit Chirag, MD      . tamsulosin (FLOMAX) capsule 0.4 mg  0.4 mg Oral Daily Johnson, Clanford L, MD   0.4 mg at 09/28/18 0910  . umeclidinium bromide (INCRUSE ELLIPTA) 62.5 MCG/INH 1 puff  1 puff Inhalation Daily Johnson, Clanford L, MD   1 puff at 09/28/18 1101  . vancomycin (VANCOCIN) IVPB 1000 mg/200 mL premix  1,000 mg Intravenous Q24H Cleora FleetJohnson, Clanford L, MD   Stopped at 09/27/18 1355  . vitamin B-12 (CYANOCOBALAMIN) tablet 1,000 mcg  1,000 mcg Oral Daily Laural BenesJohnson, Clanford L, MD   1,000 mcg at 09/28/18 [id number]     Discharge Medications: Please see discharge summary for a list of discharge medications.  Relevant Imaging Results:  Relevant Lab Results:   Additional Information    Ida Rogueodney B Thaddeaus Monica, LCSW

## 2018-09-28 NOTE — TOC Initial Note (Addendum)
Transition of Care [facility]) - Initial/Assessment Note    Patient Details  Name: Wayne [name] MRN: [id number] Date of Birth: 1921-06-27  [facility]) CM/SW Contact:    Trish Mage, LCSW Phone Number: 09/28/2018, 1:29 PM  Clinical Narrative:   Wayne [name] is a 85 YO Caucasian male admitted with acute and chronic respiratory failure with hypoxia.  Although PT recommended HH yesterday, today they recommended SNF based on Wayne [name]'s performance, and he concurs that would be in his best interest.  He currently lives by himself at home, and up to this point has been able to tend to all his own ADL's.  He states his son is his POA, and asked that I call him to update him.  He states he wants to go to [facility], and will give me no back up options.     Spoke to [place] at [facility] who made bed offer and will work on Ship broker.  They will need a COVID test at d/c that is no older than 48 hours.            Expected Discharge Plan: Skilled Nursing Facility Barriers to Discharge: No Barriers Identified   Patient Goals and CMS Choice Patient states their goals for this hospitalization and ongoing recovery are:: "I want to go to [facility].  I know the people there.  That's where my wife was." CMS Medicare.gov Compare Post Acute Care list provided to:: Patient Choice offered to / list presented to : Patient  Expected Discharge Plan and Services Expected Discharge Plan: [place]   Discharge Planning Services: CM Consult Post Acute Care Choice: [place] Living arrangements for the past 2 months: Single Family Home                                      Prior Living Arrangements/Services Living arrangements for the past 2 months: Single Family Home Lives with:: Self Patient language and need for interpreter reviewed:: Yes Do you feel safe going back to the place where you live?: Yes      Need for Family Participation  in Patient Care: Yes (Comment) Care giver support system in place?: Yes (comment) Current home services: DME Criminal Activity/Legal Involvement Pertinent to Current Situation/Hospitalization: No - Comment as needed  Activities of Daily Living Home Assistive Devices/Equipment: Cane (specify quad or straight), Dentures (specify type), Oxygen, Walker (specify type) ADL Screening (condition at time of admission) Patient's cognitive ability adequate to safely complete daily activities?: No Is the patient deaf or have difficulty hearing?: Yes Does the patient have difficulty seeing, even when wearing glasses/contacts?: No Does the patient have difficulty concentrating, remembering, or making decisions?: No Patient able to express need for assistance with ADLs?: Yes Does the patient have difficulty dressing or bathing?: Yes Independently performs ADLs?: Yes (appropriate for developmental age) Does the patient have difficulty walking or climbing stairs?: Yes Weakness of Legs: Both Weakness of Arms/Hands: None  Permission Sought/Granted Permission sought to share information with : Family Supports Permission granted to share information with : Yes, Verbal Permission Granted  Share Information with NAME: Alix Stowers     Permission granted to share info w Relationship: son  Permission granted to share info w Contact Information: [phone]  Emotional Assessment Appearance:: Appears stated age Attitude/Demeanor/Rapport: Engaged Affect (typically observed): Apprehensive Orientation: : Oriented to Self, Oriented to Place, Oriented to  Time, Oriented to Situation Alcohol / Substance Use: Not Applicable Psych Involvement: No (comment)  Admission diagnosis:  Shortness of breath [R06.02] Recurrent pneumonia [J18.9] Right-sided chest pain [R07.9] Sepsis, due to unspecified organism, unspecified whether acute organ dysfunction present [facility](HCC) [A41.9] Patient Active Problem List   Diagnosis  Date Noted  . Acute and chronic respiratory failure with hypoxia (HCC) 06-29-18  . GERD (gastroesophageal reflux disease)   . Bilateral pneumonia   . Leukocytosis   . Sepsis due to pneumonia (HCC)   . Atrial flutter (HCC) 09/18/2018  . SVT (supraventricular tachycardia) (HCC) 09/17/2018  . Rib fractures 09/15/2018  . Acute on chronic respiratory failure with hypoxia (HCC) 09/15/2018  . COPD with acute exacerbation (HCC) 09/15/2018  . Contusion of chest   . Hypoxia 09/14/2018  . Chronic low back pain   . HLD (hyperlipidemia)   . HTN (hypertension)   . Ejection fraction   . Pulmonary hypertension (HCC)   . EDEMA 01/21/2010  . ATHEROSLERO NATV ART EXTREM W/INTERMIT CLAUDICAT 07/10/2009  . Essential hypertension, benign 12/05/2008  . HYPERLIPIDEMIA-MIXED 11/19/2008  . CAD, NATIVE VESSEL 11/19/2008  . CHEST PAIN-UNSPECIFIED 11/19/2008   PCP:  Kirstie PeriShah, Ashish, MD Pharmacy:   [phone] - [place], [place] - [address] [place] [id number] Phone: [phone] Fax: [phone]     Social Determinants of Health (SDOH) Interventions    Readmission Risk Interventions Readmission Risk Prevention Plan 09/27/2018  Transportation Screening Complete  Social Work Consult for Recovery Care Planning/Counseling Complete  Palliative Care Screening Not Applicable  Medication Review Oceanographer(RN Care Manager) Complete  Some recent data might be hidden
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
## 2018-09-29 ENCOUNTER — Inpatient Hospital Stay (HOSPITAL_COMMUNITY): Payer: Medicare Other

## 2018-09-29 LAB — COMPREHENSIVE METABOLIC PANEL
ALT: 20 U/L (ref 0–44)
AST: 18 U/L (ref 15–41)
Albumin: 2.6 g/dL — ABNORMAL LOW (ref 3.5–5.0)
Alkaline Phosphatase: 47 U/L (ref 38–126)
Anion gap: 8 (ref 5–15)
BUN: 34 mg/dL — ABNORMAL HIGH (ref 8–23)
CO2: 31 mmol/L (ref 22–32)
Calcium: 8.3 mg/dL — ABNORMAL LOW (ref 8.9–10.3)
Chloride: 97 mmol/L — ABNORMAL LOW (ref 98–111)
Creatinine, Ser: 0.85 mg/dL (ref 0.61–1.24)
GFR calc Af Amer: >60 mL/min (ref >60)
GFR calc non Af Amer: >60 mL/min (ref >60)
Glucose, Bld: 128 mg/dL — ABNORMAL HIGH (ref 70–99)
Potassium: 4.5 mmol/L (ref 3.5–5.1)
Sodium: 136 mmol/L (ref 135–145)
Total Bilirubin: 1.8 mg/dL — ABNORMAL HIGH (ref 0.3–1.2)
Total Protein: 5.5 g/dL — ABNORMAL LOW (ref 6.5–8.1)

## 2018-09-29 LAB — CBC WITH DIFFERENTIAL/PLATELET
Abs Immature Granulocytes: 0.14 10*3/uL — ABNORMAL HIGH (ref 0.00–0.07)
Basophils Absolute: 0 10*3/uL (ref 0.0–0.1)
Basophils Relative: 0 %
Eosinophils Absolute: 0 10*3/uL (ref 0.0–0.5)
Eosinophils Relative: 0 %
HCT: 30.1 % — ABNORMAL LOW (ref 39.0–52.0)
Hemoglobin: 9.4 g/dL — ABNORMAL LOW (ref 13.0–17.0)
Immature Granulocytes: 1 %
Lymphocytes Relative: 2 %
Lymphs Abs: 0.3 10*3/uL — ABNORMAL LOW (ref 0.7–4.0)
MCH: 32.6 pg (ref 26.0–34.0)
MCHC: 31.2 g/dL (ref 30.0–36.0)
MCV: 104.5 fL — ABNORMAL HIGH (ref 80.0–100.0)
Monocytes Absolute: 0.6 10*3/uL (ref 0.1–1.0)
Monocytes Relative: 4 %
Neutro Abs: 14 10*3/uL — ABNORMAL HIGH (ref 1.7–7.7)
Neutrophils Relative %: 93 %
Platelets: 164 10*3/uL (ref 150–400)
RBC: 2.88 MIL/uL — ABNORMAL LOW (ref 4.22–5.81)
RDW: 12.7 % (ref 11.5–15.5)
WBC: 15.1 10*3/uL — ABNORMAL HIGH (ref 4.0–10.5)
nRBC: 0 % (ref 0.0–0.2)

## 2018-09-29 LAB — LEGIONELLA PNEUMOPHILA SEROGP 1 UR AG: L. pneumophila Serogp 1 Ur Ag: NEGATIVE

## 2018-09-29 LAB — MAGNESIUM: Magnesium: 2.1 mg/dL (ref 1.7–2.4)

## 2018-09-29 MED ORDER — TEMAZEPAM 7.5 MG PO CAPS: 7.5000 mg | ORAL_CAPSULE | Freq: Every evening | ORAL | Status: DC | PRN

## 2018-09-29 MED ORDER — MORPHINE SULFATE (PF) 2 MG/ML IV SOLN: 1.0000 mg | INTRAVENOUS | Status: DC | PRN

## 2018-09-29 MED ORDER — GLYCOPYRROLATE 0.2 MG/ML IJ SOLN: 0.2000 mg | INTRAMUSCULAR | Status: DC | PRN

## 2018-09-29 MED ORDER — ACETAMINOPHEN 650 MG RE SUPP: 650.0000 mg | Freq: Four times a day (QID) | RECTAL | Status: DC | PRN

## 2018-09-29 MED ORDER — IPRATROPIUM-ALBUTEROL 0.5-2.5 (3) MG/3ML IN SOLN: 3.0000 mL | Freq: Four times a day (QID) | RESPIRATORY_TRACT | Status: DC

## 2018-09-29 MED ORDER — SENNA 8.6 MG PO TABS: 1.0000 | ORAL_TABLET | Freq: Every evening | ORAL | Status: DC | PRN

## 2018-09-29 MED ORDER — BACLOFEN 10 MG PO TABS: 5.0000 mg | ORAL_TABLET | Freq: Two times a day (BID) | ORAL | Status: DC | PRN

## 2018-09-29 MED ORDER — MAGIC MOUTHWASH: 15.0000 mL | Freq: Four times a day (QID) | ORAL | Status: DC | PRN

## 2018-09-29 MED ORDER — LORAZEPAM 1 MG PO TABS: 1.0000 mg | ORAL_TABLET | ORAL | Status: DC | PRN

## 2018-09-29 MED ORDER — FUROSEMIDE 10 MG/ML IJ SOLN
80.0000 mg | Freq: Once | INTRAMUSCULAR | Status: AC
  Administered 2018-09-29: 80 mg via INTRAVENOUS
  Filled 2018-09-29: qty 8

## 2018-09-29 MED ORDER — ONDANSETRON HCL 4 MG/2ML IJ SOLN: 4.0000 mg | Freq: Four times a day (QID) | INTRAMUSCULAR | Status: DC | PRN

## 2018-09-29 MED ORDER — ALPRAZOLAM 0.25 MG PO TABS
0.2500 mg | ORAL_TABLET | Freq: Once | ORAL | Status: AC
  Administered 2018-09-29: 0.25 mg via ORAL
  Filled 2018-09-29: qty 1

## 2018-09-29 MED ORDER — LORAZEPAM 2 MG/ML IJ SOLN: 1.0000 mg | INTRAMUSCULAR | Status: DC | PRN

## 2018-09-29 MED ORDER — ONDANSETRON 4 MG PO TBDP: 4.0000 mg | ORAL_TABLET | Freq: Four times a day (QID) | ORAL | Status: DC | PRN

## 2018-09-29 MED ORDER — ACETAMINOPHEN 325 MG PO TABS: 650.0000 mg | ORAL_TABLET | Freq: Four times a day (QID) | ORAL | Status: DC | PRN

## 2018-09-29 MED ORDER — LORAZEPAM 2 MG/ML PO CONC: 1.0000 mg | ORAL | Status: DC | PRN

## 2018-09-29 MED ORDER — BIOTENE DRY MOUTH MT LIQD: 15.0000 mL | OROMUCOSAL | Status: DC | PRN

## 2018-09-29 MED ORDER — GLYCOPYRROLATE 1 MG PO TABS: 1.0000 mg | ORAL_TABLET | ORAL | Status: DC | PRN

## 2018-09-29 MED ORDER — HALOPERIDOL LACTATE 2 MG/ML PO CONC
0.5000 mg | ORAL | Status: DC | PRN
  Filled 2018-09-29: qty 0.3

## 2018-09-29 MED ORDER — HALOPERIDOL LACTATE 5 MG/ML IJ SOLN: 0.5000 mg | INTRAMUSCULAR | Status: DC | PRN

## 2018-09-29 MED ORDER — HALOPERIDOL 0.5 MG PO TABS: 0.5000 mg | ORAL_TABLET | ORAL | Status: DC | PRN

## 2018-09-29 MED ORDER — OXYBUTYNIN CHLORIDE 5 MG PO TABS: 2.5000 mg | ORAL_TABLET | Freq: Four times a day (QID) | ORAL | Status: DC | PRN

## 2018-09-29 MED ORDER — POLYVINYL ALCOHOL 1.4 % OP SOLN: 1.0000 [drp] | Freq: Four times a day (QID) | OPHTHALMIC | Status: DC | PRN
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
## 2018-09-30 NOTE — Progress Notes (Signed)
Patient has been very restless. Unable to swallow any water. Patient has maintained oxygen saturations below 90% consistently, despite being on 15L of High flow oxygen. Patient's son, Patrick Jupiter, has been called and will be coming up here. MD notified and aware of situation. Chest xray was obtained. ELink made aware to watch patient. Patient is very clammy and very confused. Will provide comfort to patient, and son when he arrives.

## 2018-09-30 NOTE — Progress Notes (Signed)
PT Cancellation Note  Patient Details Name: Wayne [name] MRN: [id number] DOB: 03/24/1921   Cancelled Treatment:    Reason Eval/Treat Not Completed: Medical issues which prohibited therapy.  Therapy held per RN request secondary to patient not doing well.   1:49 PM, 2018-10-02 Lonell Grandchild, MPT Physical Therapist with [facility] 336 [phone] office [phone] mobile phone

## 2018-09-30 NOTE — Progress Notes (Signed)
Patient expired at 2020 and verified by Marlowe Shores, RN. Family made aware of patient's death. Family to come get patient's belongings 8/1. MD made aware. Spoke with Ronney Asters at [facility], referral number [phone].

## 2018-09-30 NOTE — Progress Notes (Signed)
Patient transitioned to comfort care. Oxygen weaned down. Son, Wayne [name] and wife at bedside.

## 2018-09-30 NOTE — Progress Notes (Signed)
Changed dressing to left lower leg per order.

## 2018-09-30 NOTE — TOC Progression Note (Signed)
Transition of Care [facility]) - Progression Note    Patient Details  Name: Wayne [name] MRN: [id number] Date of Birth: 04/10/1921  Transition of Care [facility]) CM/SW [place], [place] Phone Number: 09/09/2018, 9:03 AM  Clinical Narrative:   Mardene Celeste from [facility] called stating insurance has authorized placement.  Pt now needs updated COVID test as previous one is over 39 hours old.  If patient d/c's Saturday, call report to [phone] and FAX d/c summary by 10:30AM to [phone].  They do not admit on Sundays.    Expected Discharge Plan: [place] Barriers to Discharge: No Barriers Identified  Expected Discharge Plan and Services Expected Discharge Plan: [place]   Discharge Planning Services: CM Consult Post Acute Care Choice: [place] Living arrangements for the past 2 months: Single Family Home                                       Social Determinants of Health (SDOH) Interventions    Readmission Risk Interventions Readmission Risk Prevention Plan 09/27/2018  Transportation Screening Complete  Social Work Consult for [place] Planning/Counseling [place] Not Applicable  Medication Review Press photographer) Complete  Some recent data might be hidden

## 2018-09-30 NOTE — Progress Notes (Signed)
PO medications not given due to patient being unable to tolerate drinking liquids and not able to follow commands. Patient is restless and unable to follow commands. Patient is responsive to verbal stimuli and touch. Son, Patrick Jupiter, is at bedside. 80mg  of Lasix was given IV due to patient's chest xray results.
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
## 2018-09-30 NOTE — Progress Notes (Signed)
Patient continues to decline now. His HR in 60s. SBP 60s. Sat 84% on 15L [place].   Spoke with the patient's son Patrick Jupiter again. Very critically ill. He is aware, anticipate death very soon.   Will change him to comfort measure. Him and wife will try coming to the hospital.   Primary Diagnosis: 1) Acute Hypoxic on Chronic Respiratory failure 2) Multifocal HCAP 3) COPD 4) Coronary Artery Disease   Spoke with RN.   Gerlean Ren MD

## 2018-09-30 NOTE — Progress Notes (Signed)
Patient starting to wheeze slightly in L upper and lower lobes. MD aware. Respiratory to give breathing treatment soon.

## 2018-09-30 NOTE — Progress Notes (Signed)
Pharmacy Antibiotic Note  Wayne [name] is a 85 y.o. male admitted on October 23, 2018 with pneumonia.  Pharmacy has been consulted for Vancomycin and Cefepime dosing.  Plan: Continue Vancomycin 1000 mg IV every 24 hours. Goal trough 15-20 mcg/mL. Continue Cefepime 2000 mg IV every 12 hours. Monitor labs, c/s, and vanco levels as indicated.  Height: 5\' 10"  (177.8 cm) Weight: 139 lb 8.8 oz (63.3 kg) IBW/kg (Calculated) : 73  Temp (24hrs), Avg:98.4 F (36.9 C), Min:98.3 F (36.8 C), Max:98.5 F (36.9 C)  Recent Labs  Lab 23-Oct-2018 1015 2018/10/23 1054 10/23/18 1205 09/27/18 0500 09/28/18 0424 09/23/2018 0417  WBC  --  40.6*  --  31.1* 18.8* 15.1*  CREATININE 0.92  --   --  0.91 0.93 0.85  LATICACIDVEN 1.7  --  1.0  --   --   --     Estimated Creatinine Clearance: 46.5 mL/min (by C-G formula based on SCr of 0.85 mg/dL).    Allergies  Allergen Reactions  . Codeine     cramping  . Cortisone     cramping    Antimicrobials this admission: Vanco 7/28 >> Cefepime 7/28 >> Flagyl 7/28 >>   Dose adjustments this admission: Cefepime/Vanco  Microbiology results: 7/28 BCx: ngtd 7/28 UCx: ng 7/28 MRSA PCR: positive  Thank you for allowing pharmacy to be a part of this patient's care.  Ramond Craver 09/25/2018 11:00 AM

## 2018-09-30 NOTE — Progress Notes (Signed)
MOST form filled out, and signed by Dr. Reesa Chew and son, wayne, and placed in patient's chart.

## 2018-09-30 NOTE — Progress Notes (Signed)
Patient has become very restless and anxious, continues to pull oxygen off. Patient states he does not want to be here or receive any more treatment, he states he is ready to pass and "go to heaven soon." Patient desats quickly without oxygen but remains alert and oriented, educated patient on relaxation and breathing techniques. Patient agreeable to taking something to help him relax and sleep as he has been awake all night. MD aware of need for something to help him rest. Orders placed and followed.

## 2018-09-30 DEATH — deceased

## 2018-10-01 LAB — CULTURE, BLOOD (ROUTINE X 2)
Culture: NO GROWTH
Culture: NO GROWTH
Special Requests: ADEQUATE
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
